# Patient Record
Sex: Female | Born: 1955 | Hispanic: No | Marital: Married | Smoking: Current every day smoker
Health system: Southern US, Community
[De-identification: ages and names within clinical notes are randomized; demographics above are authoritative.]

## PROBLEM LIST (undated history)

## (undated) DIAGNOSIS — I219 Acute myocardial infarction, unspecified: Secondary | ICD-10-CM

## (undated) DIAGNOSIS — E039 Hypothyroidism, unspecified: Secondary | ICD-10-CM

## (undated) DIAGNOSIS — E079 Disorder of thyroid, unspecified: Secondary | ICD-10-CM

## (undated) HISTORY — PX: NO PAST SURGERIES: SHX2092

---

## 2002-02-16 ENCOUNTER — Inpatient Hospital Stay (HOSPITAL_COMMUNITY): Admission: EM | Admit: 2002-02-16 | Discharge: 2002-02-27 | Payer: Self-pay | Admitting: Psychiatry

## 2002-03-20 ENCOUNTER — Encounter: Admission: RE | Admit: 2002-03-20 | Discharge: 2002-03-20 | Payer: Self-pay | Admitting: *Deleted

## 2003-10-27 ENCOUNTER — Other Ambulatory Visit: Payer: Self-pay

## 2003-10-28 ENCOUNTER — Other Ambulatory Visit: Payer: Self-pay

## 2008-09-28 ENCOUNTER — Emergency Department: Payer: Self-pay | Admitting: Emergency Medicine

## 2011-05-16 ENCOUNTER — Inpatient Hospital Stay: Payer: Self-pay | Admitting: Internal Medicine

## 2013-05-14 ENCOUNTER — Emergency Department: Payer: Self-pay

## 2013-10-27 ENCOUNTER — Emergency Department: Payer: Self-pay | Admitting: Emergency Medicine

## 2013-10-27 LAB — CBC WITH DIFFERENTIAL/PLATELET
Basophil #: 0.1 10*3/uL (ref 0.0–0.1)
Basophil %: 0.9 %
Eosinophil #: 0.1 10*3/uL (ref 0.0–0.7)
Eosinophil %: 1.4 %
HGB: 13.6 g/dL (ref 12.0–16.0)
Lymphocyte #: 1.8 10*3/uL (ref 1.0–3.6)
Lymphocyte %: 29.3 %
MCH: 27 pg (ref 26.0–34.0)
MCHC: 33.1 g/dL (ref 32.0–36.0)
MCV: 82 fL (ref 80–100)
Monocyte #: 0.6 x10 3/mm (ref 0.2–0.9)
Monocyte %: 10.1 %
Neutrophil #: 3.6 10*3/uL (ref 1.4–6.5)
Neutrophil %: 58.3 %
Platelet: 182 10*3/uL (ref 150–440)
RBC: 5.05 10*6/uL (ref 3.80–5.20)
RDW: 13.9 % (ref 11.5–14.5)
WBC: 6.2 10*3/uL (ref 3.6–11.0)

## 2013-10-27 LAB — BASIC METABOLIC PANEL
Anion Gap: 6 — ABNORMAL LOW (ref 7–16)
BUN: 9 mg/dL (ref 7–18)
Calcium, Total: 8.5 mg/dL (ref 8.5–10.1)
Chloride: 105 mmol/L (ref 98–107)
Co2: 30 mmol/L (ref 21–32)
EGFR (African American): >60
Glucose: 175 mg/dL — ABNORMAL HIGH (ref 65–99)
Osmolality: 284 (ref 275–301)
Potassium: 3 mmol/L — ABNORMAL LOW (ref 3.5–5.1)
Sodium: 141 mmol/L (ref 136–145)

## 2013-10-27 LAB — TROPONIN I: Troponin-I: <0.02 ng/mL

## 2013-10-27 LAB — URINALYSIS, COMPLETE
Glucose,UR: NEGATIVE mg/dL (ref 0–75)
Nitrite: NEGATIVE
Protein: 100
RBC,UR: 42 /HPF (ref 0–5)
Squamous Epithelial: 10
WBC UR: 9 /HPF (ref 0–5)

## 2017-07-22 ENCOUNTER — Emergency Department: Payer: Medicare Other

## 2017-07-22 ENCOUNTER — Observation Stay
Admission: EM | Admit: 2017-07-22 | Discharge: 2017-07-23 | Disposition: A | Discharge status: Home / Self Care | Payer: Medicare Other | Attending: Internal Medicine | Admitting: Internal Medicine

## 2017-07-22 ENCOUNTER — Encounter: Payer: Self-pay | Admitting: Emergency Medicine

## 2017-07-22 DIAGNOSIS — N179 Acute kidney failure, unspecified: Secondary | ICD-10-CM | POA: Diagnosis present

## 2017-07-22 DIAGNOSIS — F17200 Nicotine dependence, unspecified, uncomplicated: Secondary | ICD-10-CM | POA: Insufficient documentation

## 2017-07-22 DIAGNOSIS — I252 Old myocardial infarction: Secondary | ICD-10-CM | POA: Diagnosis not present

## 2017-07-22 DIAGNOSIS — E039 Hypothyroidism, unspecified: Secondary | ICD-10-CM | POA: Diagnosis not present

## 2017-07-22 DIAGNOSIS — Z91041 Radiographic dye allergy status: Secondary | ICD-10-CM | POA: Diagnosis not present

## 2017-07-22 DIAGNOSIS — R000 Tachycardia, unspecified: Secondary | ICD-10-CM | POA: Diagnosis not present

## 2017-07-22 DIAGNOSIS — N181 Chronic kidney disease, stage 1: Secondary | ICD-10-CM | POA: Insufficient documentation

## 2017-07-22 DIAGNOSIS — G9340 Encephalopathy, unspecified: Secondary | ICD-10-CM | POA: Diagnosis present

## 2017-07-22 DIAGNOSIS — Z1389 Encounter for screening for other disorder: Secondary | ICD-10-CM | POA: Diagnosis not present

## 2017-07-22 DIAGNOSIS — R55 Syncope and collapse: Principal | ICD-10-CM | POA: Diagnosis present

## 2017-07-22 HISTORY — DX: Acute myocardial infarction, unspecified: I21.9

## 2017-07-22 HISTORY — DX: Disorder of thyroid, unspecified: E07.9

## 2017-07-22 LAB — RAPID HIV SCREEN (HIV 1/2 AB+AG)
HIV 1/2 Antibodies: NONREACTIVE
HIV-1 P24 Antigen - HIV24: NONREACTIVE

## 2017-07-22 LAB — CBC
HEMATOCRIT: 40.2 % (ref 35.0–47.0)
Hemoglobin: 13.4 g/dL (ref 12.0–16.0)
MCH: 26.9 pg (ref 26.0–34.0)
MCHC: 33.5 g/dL (ref 32.0–36.0)
MCV: 80.4 fL (ref 80.0–100.0)
PLATELETS: 224 10*3/uL (ref 150–440)
RBC: 4.99 MIL/uL (ref 3.80–5.20)
RDW: 13.6 % (ref 11.5–14.5)
WBC: 8.1 10*3/uL (ref 3.6–11.0)

## 2017-07-22 LAB — HEPATIC FUNCTION PANEL
ALBUMIN: 3.6 g/dL (ref 3.5–5.0)
ALT: 25 U/L (ref 14–54)
AST: 34 U/L (ref 15–41)
Alkaline Phosphatase: 124 U/L (ref 38–126)
Total Bilirubin: 0.8 mg/dL (ref 0.3–1.2)
Total Protein: 7.5 g/dL (ref 6.5–8.1)

## 2017-07-22 LAB — TSH: TSH: 4.157 u[IU]/mL (ref 0.350–4.500)

## 2017-07-22 LAB — BASIC METABOLIC PANEL
Anion gap: 9 (ref 5–15)
BUN: 22 mg/dL — ABNORMAL HIGH (ref 6–20)
CHLORIDE: 105 mmol/L (ref 101–111)
CO2: 27 mmol/L (ref 22–32)
CREATININE: 1.25 mg/dL — AB (ref 0.44–1.00)
Calcium: 9.4 mg/dL (ref 8.9–10.3)
GFR calc non Af Amer: 45 mL/min — ABNORMAL LOW (ref >60)
GFR, EST AFRICAN AMERICAN: 53 mL/min — AB (ref >60)
Glucose, Bld: 108 mg/dL — ABNORMAL HIGH (ref 65–99)
POTASSIUM: 3.7 mmol/L (ref 3.5–5.1)
SODIUM: 141 mmol/L (ref 135–145)

## 2017-07-22 LAB — T4, FREE: Free T4: 0.81 ng/dL (ref 0.61–1.12)

## 2017-07-22 LAB — TROPONIN I

## 2017-07-22 LAB — AMMONIA: Ammonia: 11 umol/L (ref 9–35)

## 2017-07-22 NOTE — ED Notes (Addendum)
Pt returned from xray  Family at the bedside 

## 2017-07-22 NOTE — ED Provider Notes (Signed)
[facility] Emergency Department Provider Note    First MD Initiated Contact with Patient 07/22/17 2017     (approximate)  I have reviewed the triage vital signs and the nursing notes.   HISTORY  Chief Complaint Loss of Consciousness   HPI Mary Madden is a 61 y.o. female with a reported history of thyroid disease not currently taking any medications presents with an unwitnessed syncopal episode today that occurred somewhere between 3:00 and 3:30 while the patient was cooking dinner. Family has also noted increasing confusion but it is very difficult to obtained event of history from patient or family members whether this confusion is new as they state that the patient has a "chemical imbalance and is normally sort of confused but nothing like this ". No new medications. No fevers. No cough or shortness of breath. Denies any chest pain.   Past Medical History:  Diagnosis Date  . Thyroid disease    No family history on file. No past surgical history on file. There are no active problems to display for this patient.     Prior to Admission medications   Not on File    Allergies Contrast media [iodinated diagnostic agents]    Social History Social History  Substance Use Topics  . Smoking status: Current Every Day Smoker  . Smokeless tobacco: Never Used  . Alcohol use No    Review of Systems Patient denies headaches, rhinorrhea, blurry vision, numbness, shortness of breath, chest pain, edema, cough, abdominal pain, nausea, vomiting, diarrhea, dysuria, fevers, rashes or hallucinations unless otherwise stated above in HPI. ____________________________________________   PHYSICAL EXAM:  VITAL SIGNS: Vitals:   07/22/17 2137 07/22/17 2200  BP: 133/72 125/69  Pulse: 92 89  Resp: 14 18  Temp:    SpO2: 100% 99%    Constitutional: Alert slow to answer questions, poor historian, in no acute distress. Eyes: Conjunctivae are normal.  Head:  Atraumatic. Nose: No congestion/rhinnorhea. Mouth/Throat: Mucous membranes are moist.   Neck: No stridor. Painless ROM.  Cardiovascular: Normal rate, regular rhythm. Grossly normal heart sounds.  Good peripheral circulation. Respiratory: Normal respiratory effort.  No retractions. Lungs CTAB. Gastrointestinal: Soft and nontender. No distention. No abdominal bruits. No CVA tenderness. Genitourinary:  Musculoskeletal: No lower extremity tenderness nor edema.  No joint effusions. Neurologic:  Slow to answer questions. No gross focal neurologic deficits are appreciated. No facial droop.  No asterixis or tremor.  CN intact Skin:  Skin is warm, dry and intact. No rash noted. Psychiatric: Mood and affect are normal. Speech and behavior are normal.  ____________________________________________   LABS (all labs ordered are listed, but only abnormal results are displayed)  Results for orders placed or performed during the hospital encounter of 07/22/17 (from the past 24 hour(s))  Basic metabolic panel     Status: Abnormal   Collection Time: 07/22/17  8:16 PM  Result Value Ref Range   Sodium 141 135 - 145 mmol/L   Potassium 3.7 3.5 - 5.1 mmol/L   Chloride 105 101 - 111 mmol/L   CO2 27 22 - 32 mmol/L   Glucose, Bld 108 (H) 65 - 99 mg/dL   BUN 22 (H) 6 - 20 mg/dL   Creatinine, Ser 7.42 (H) 0.44 - 1.00 mg/dL   Calcium 9.4 8.9 - 59.5 mg/dL   GFR calc non Af Amer 45 (L) >60 mL/min   GFR calc Af Amer 53 (L) >60 mL/min   Anion gap 9 5 - 15  CBC  Status: None   Collection Time: 07/22/17  8:16 PM  Result Value Ref Range   WBC 8.1 3.6 - 11.0 K/uL   RBC 4.99 3.80 - 5.20 MIL/uL   Hemoglobin 13.4 12.0 - 16.0 g/dL   HCT 84.6 96.2 - 95.2 %   MCV 80.4 80.0 - 100.0 fL   MCH 26.9 26.0 - 34.0 pg   MCHC 33.5 32.0 - 36.0 g/dL   RDW 84.1 32.4 - 40.1 %   Platelets 224 150 - 440 K/uL  Hepatic function panel     Status: Abnormal   Collection Time: 07/22/17  8:34 PM  Result Value Ref Range   Total  Protein 7.5 6.5 - 8.1 g/dL   Albumin 3.6 3.5 - 5.0 g/dL   AST 34 15 - 41 U/L   ALT 25 14 - 54 U/L   Alkaline Phosphatase 124 38 - 126 U/L   Total Bilirubin 0.8 0.3 - 1.2 mg/dL   Bilirubin, Direct <0.2 (L) 0.1 - 0.5 mg/dL   Indirect Bilirubin NOT CALCULATED 0.3 - 0.9 mg/dL  Troponin I     Status: None   Collection Time: 07/22/17  8:34 PM  Result Value Ref Range   Troponin I <0.03 <0.03 ng/mL  TSH     Status: None   Collection Time: 07/22/17  8:34 PM  Result Value Ref Range   TSH 4.157 0.350 - 4.500 uIU/mL  T4, free     Status: None   Collection Time: 07/22/17  8:34 PM  Result Value Ref Range   Free T4 0.81 0.61 - 1.12 ng/dL  Ammonia     Status: None   Collection Time: 07/22/17  9:20 PM  Result Value Ref Range   Ammonia 11 9 - 35 umol/L  Blood gas, venous     Status: Abnormal (Preliminary result)   Collection Time: 07/22/17  9:20 PM  Result Value Ref Range   FIO2 0.21    pH, Ven 7.35 7.250 - 7.430   pCO2, Ven 52 44.0 - 60.0 mmHg   pO2, Ven PENDING 32.0 - 45.0 mmHg   Bicarbonate 28.7 (H) 20.0 - 28.0 mmol/L   Acid-Base Excess 2.0 0.0 - 2.0 mmol/L   O2 Saturation PENDING %   Patient temperature 37.0    Collection site VENOUS    Sample type VENOUS    ____________________________________________  EKG My review and personal interpretation at Time: 20:05   Indication: syncope  Rate: 100  Rhythm: sinus Axis: normal Other: normal intervals, no stemi, no depressions ____________________________________________  RADIOLOGY  I personally reviewed all radiographic images ordered to evaluate for the above acute complaints and reviewed radiology reports and findings.  These findings were personally discussed with the patient.  Please see medical record for radiology report.  ____________________________________________   PROCEDURES  Procedure(s) performed:  Procedures    Critical Care performed: no ____________________________________________   INITIAL IMPRESSION /  ASSESSMENT AND PLAN / ED COURSE  Pertinent labs & imaging results that were available during my care of the patient were reviewed by me and considered in my medical decision making (see chart for details).  DDX: Dehydration, sepsis, pna, uti, hypoglycemia, cva, drug effect, withdrawal, encephalitis   Mary Madden is a 61 y.o. who presents to the ED with symptoms as described above. Very limited history on the syncopal event. Patient does seem very confused and encephalopathic. No fever and no meningismus to suggest encephalitis. CT imaging will be ordered to evaluate for CVA or mass. On review the patient's medical record  it does appear that at some point she was on complera and had seen infectious disease at [facility] but denies any history of HIV and I cannot find any more history on this. Also has a history of hypothyroid. May be profoundly hypothyroid The patient will be placed on continuous pulse oximetry and telemetry for monitoring.  Laboratory evaluation will be sent to evaluate for the above complaints.       ----------------------------------------- 10:42 PM on 07/22/2017 -----------------------------------------  Patient with stable vital signs and no signs of dysrhythmia on the monitor but still confused and based on blood work here in the ER I do not have explanation for her encephalopathy or syncopal episode. Do believe the patient should be admitted to the hospital for further evaluation and management.  ____________________________________________   FINAL CLINICAL IMPRESSION(S) / ED DIAGNOSES  Final diagnoses:  Syncope and collapse  Acute encephalopathy      NEW MEDICATIONS STARTED DURING THIS VISIT:  New Prescriptions   No medications on file     Note:  This document was prepared using Dragon voice recognition software and may include unintentional dictation errors.    Willy Eddy, MD 07/22/17 [phone]

## 2017-07-22 NOTE — ED Triage Notes (Signed)
Patient presents to ED via POV from home. Patient was found by family earlier today on the floor "passed out" per family. EMS was called but patient refused transport. Patient reports left sided pain. Patient is slow to respond. Family reports patient has a chemical imbalance and is normally slightly confused "but nothing like this". Unable to obtain accurate medical history. Patient ambulatory to to room. Equal but weak hand grasps noted.

## 2017-07-23 LAB — BASIC METABOLIC PANEL
Anion gap: 9 (ref 5–15)
BUN: 20 mg/dL (ref 6–20)
CO2: 26 mmol/L (ref 22–32)
CREATININE: 0.97 mg/dL (ref 0.44–1.00)
Calcium: 9.1 mg/dL (ref 8.9–10.3)
Chloride: 107 mmol/L (ref 101–111)
Glucose, Bld: 85 mg/dL (ref 65–99)
POTASSIUM: 3.8 mmol/L (ref 3.5–5.1)
SODIUM: 142 mmol/L (ref 135–145)

## 2017-07-23 LAB — URINALYSIS, COMPLETE (UACMP) WITH MICROSCOPIC
BACTERIA UA: NONE SEEN
Bilirubin Urine: NEGATIVE
Glucose, UA: NEGATIVE mg/dL
Ketones, ur: 5 mg/dL — AB
NITRITE: NEGATIVE
PH: 5 (ref 5.0–8.0)
Protein, ur: NEGATIVE mg/dL
SPECIFIC GRAVITY, URINE: 1.019 (ref 1.005–1.030)

## 2017-07-23 LAB — CBC
HEMATOCRIT: 39.1 % (ref 35.0–47.0)
Hemoglobin: 13.2 g/dL (ref 12.0–16.0)
MCH: 27.3 pg (ref 26.0–34.0)
MCHC: 33.7 g/dL (ref 32.0–36.0)
MCV: 81.1 fL (ref 80.0–100.0)
PLATELETS: 204 10*3/uL (ref 150–440)
RBC: 4.82 MIL/uL (ref 3.80–5.20)
RDW: 13.9 % (ref 11.5–14.5)
WBC: 6.1 10*3/uL (ref 3.6–11.0)

## 2017-07-23 LAB — URINE DRUG SCREEN, QUALITATIVE (ARMC ONLY)
Amphetamines, Ur Screen: NOT DETECTED
Barbiturates, Ur Screen: NOT DETECTED
Benzodiazepine, Ur Scrn: NOT DETECTED
COCAINE METABOLITE, UR ~~LOC~~: NOT DETECTED
Cannabinoid 50 Ng, Ur ~~LOC~~: NOT DETECTED
MDMA (ECSTASY) UR SCREEN: NOT DETECTED
METHADONE SCREEN, URINE: NOT DETECTED
OPIATE, UR SCREEN: NOT DETECTED
Phencyclidine (PCP) Ur S: NOT DETECTED
TRICYCLIC, UR SCREEN: NOT DETECTED

## 2017-07-23 LAB — TROPONIN I: Troponin I: <0.03 ng/mL (ref <0.03)

## 2017-07-23 MED ORDER — ENOXAPARIN SODIUM 40 MG/0.4ML ~~LOC~~ SOLN: 40.0000 mg | SUBCUTANEOUS | Status: DC

## 2017-07-23 MED ORDER — ACETAMINOPHEN 650 MG RE SUPP: 650.0000 mg | Freq: Four times a day (QID) | RECTAL | Status: DC | PRN

## 2017-07-23 MED ORDER — ONDANSETRON HCL 4 MG/2ML IJ SOLN
4.0000 mg | Freq: Four times a day (QID) | INTRAMUSCULAR | Status: DC | PRN
Start: 2017-07-23 — End: 2017-07-23

## 2017-07-23 MED ORDER — ACETAMINOPHEN 325 MG PO TABS
325.0000 mg | ORAL_TABLET | Freq: Four times a day (QID) | ORAL | Status: DC | PRN
Start: 2017-07-23 — End: 2020-06-14

## 2017-07-23 MED ORDER — ACETAMINOPHEN 325 MG PO TABS
650.0000 mg | ORAL_TABLET | Freq: Four times a day (QID) | ORAL | Status: DC | PRN
  Administered 2017-07-23: 650 mg via ORAL
  Filled 2017-07-23: qty 2

## 2017-07-23 MED ORDER — ONDANSETRON HCL 4 MG PO TABS: 4.0000 mg | ORAL_TABLET | Freq: Four times a day (QID) | ORAL | Status: DC | PRN

## 2017-07-23 NOTE — ED Notes (Signed)
Patient transported to room 249 

## 2017-07-23 NOTE — Discharge Summary (Signed)
[facility] [place] [facility]   PATIENT NAME: Mary Madden    MR#:  [id number]  DATE OF BIRTH:  [birth year]  DATE OF ADMISSION:  07/22/2017 ADMITTING PHYSICIAN: Oralia Manis, MD  DATE OF DISCHARGE: 07/23/17  PRIMARY CARE PHYSICIAN: Patient, No Pcp Per    ADMISSION DIAGNOSIS:  Syncope and collapse [R55] Acute encephalopathy [G93.40]  DISCHARGE DIAGNOSIS:  Principal Problem:   Syncope and collapse Active Problems:   AKI (acute kidney injury) (HCC)   Hypothyroidism   SECONDARY DIAGNOSIS:   Past Medical History:  Diagnosis Date  . MI (myocardial infarction) (HCC)   . Thyroid disease     HOSPITAL COURSE:   HPI   Mary Madden  is a 61 y.o. female who presents with syncopal episode. Patient does not recall the event and it was unwitnessed at the time. Her daughter was at home with her today but was just leaving. Her husband was coming home, but was not quite home yet. Patient states she was cooking something in the kitchen, and then does not recall what happened. Her spouse found her on the floor in the kitchen when he arrived just after their daughter had left and called EMS. Here in the ED the patient's workup was largely within normal limits initially. 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 Hospitalists were called for admission and further evaluation  Syncope and collapse - Seemed to be from dehydration Acute MI ruled out with negative troponins Lightheadedness improved get an echocardiogram and cardiology Follow-up as an outpatient discussed with Dr. Vennie Homans , would like to see the patient is an outpatient in 2-3 days this week . Okay to discharge patient from cardiology standpoint     AKI (acute kidney injury) (HCC) -Improved with IV fluids , renal function is back to normal     Hypothyroidism - TSH and free T4 are normal, patient is not on any home medications  DISCHARGE  CONDITIONS:  FAIR  CONSULTS OBTAINED:  Treatment Team:  Alwyn Pea, MD   PROCEDURES none   DRUG ALLERGIES:   Allergies  Allergen Reactions  . Contrast Media [Iodinated Diagnostic Agents] Other (See Comments)    Reaction: unknown    DISCHARGE MEDICATIONS:  There are no discharge medications for this patient.    DISCHARGE INSTRUCTIONS:   F/u with cardio - dr.callwood in 2-3 days, OP echo Follow-up with primary care physician in a week   DIET:  Regular diet  DISCHARGE CONDITION:  Stable  ACTIVITY:  Activity as tolerated  OXYGEN:  Home Oxygen: No.   Oxygen Delivery: room air  DISCHARGE LOCATION:  home   If you experience worsening of your admission symptoms, develop shortness of breath, life threatening emergency, suicidal or homicidal thoughts you must seek medical attention immediately by calling 911 or calling your MD immediately  if symptoms less severe.  You Must read complete instructions/literature along with all the possible adverse reactions/side effects for all the Medicines you take and that have been prescribed to you. Take any new Medicines after you have completely understood and accpet all the possible adverse reactions/side effects.   Please note  You were cared for by a hospitalist during your hospital stay. If you have any questions about your discharge medications or the care you received while you were in the hospital after you are discharged, you can call the unit and asked to speak with the hospitalist on call if the hospitalist  that took care of you is not available. Once you are discharged, your primary care physician will handle any further medical issues. Please note that NO REFILLS for any discharge medications will be authorized once you are discharged, as it is imperative that you return to your primary care physician (or establish a relationship with a primary care physician if you do not have one) for your aftercare needs so that  they can reassess your need for medications and monitor your lab values.     Today  Chief Complaint  Patient presents with  . Loss of Consciousness   Patient denies any complaints today lightheadedness improved and denies any chest pain or shortness of breath. Daughter Mary Madden at bedside.  ROS:  CONSTITUTIONAL: Denies fevers, chills. Denies any fatigue, weakness.  EYES: Denies blurry vision, double vision, eye pain. EARS, NOSE, THROAT: Denies tinnitus, ear pain, hearing loss. RESPIRATORY: Denies cough, wheeze, shortness of breath.  CARDIOVASCULAR: Denies chest pain, palpitations, edema.  GASTROINTESTINAL: Denies nausea, vomiting, diarrhea, abdominal pain. Denies bright red blood per rectum. GENITOURINARY: Denies dysuria, hematuria. ENDOCRINE: Denies nocturia or thyroid problems. HEMATOLOGIC AND LYMPHATIC: Denies easy bruising or bleeding. SKIN: Denies rash or lesion. MUSCULOSKELETAL: Denies pain in neck, back, shoulder, knees, hips or arthritic symptoms.  NEUROLOGIC: Denies paralysis, paresthesias.  PSYCHIATRIC: Denies anxiety or depressive symptoms.   VITAL SIGNS:  Blood pressure (!) 161/81, pulse 94, temperature 97.9 F (36.6 C), resp. rate 14, height  (1.626 m), weight 65.6 kg (144 lb 9.6 oz), SpO2 100 %.  I/O:   Intake/Output Summary (Last 24 hours) at 07/23/17 1228 Last data filed at 07/23/17 0500  Gross per 24 hour  Intake                0 ml  Output                0 ml  Net                0 ml    PHYSICAL EXAMINATION:  GENERAL:  61 y.o.-year-old patient lying in the bed with no acute distress.  EYES: Pupils equal, round, reactive to light and accommodation. No scleral icterus. Extraocular muscles intact.  HEENT: Head atraumatic, normocephalic. Oropharynx and nasopharynx clear.  NECK:  Supple, no jugular venous distention. No thyroid enlargement, no tenderness.  LUNGS: Normal breath sounds bilaterally, no wheezing, rales,rhonchi or crepitation. No use of  accessory muscles of respiration.  CARDIOVASCULAR: S1, S2 normal. No murmurs, rubs, or gallops.  ABDOMEN: Soft, non-tender, non-distended. Bowel sounds present. No organomegaly or mass.  EXTREMITIES: No pedal edema, cyanosis, or clubbing.  NEUROLOGIC: Cranial nerves II through XII are intact. Muscle strength 5/5 in all extremities. Sensation intact. Gait not checked.  PSYCHIATRIC: The patient is alert and oriented x 3.  SKIN: No obvious rash, lesion, or ulcer.   DATA REVIEW:   CBC  Recent Labs Lab 07/23/17 0927  WBC 6.1  HGB 13.2  HCT 39.1  PLT 204    Chemistries   Recent Labs Lab 07/22/17 2034 07/23/17 0927  NA  --  142  K  --  3.8  CL  --  107  CO2  --  26  GLUCOSE  --  85  BUN  --  20  CREATININE  --  0.97  CALCIUM  --  9.1  AST 34  --   ALT 25  --   ALKPHOS 124  --   BILITOT 0.8  --     Cardiac Enzymes  Recent Labs Lab 07/23/17 0927  TROPONINI <0.03    Microbiology Results  No results found for this or any previous visit.  RADIOLOGY:  Dg Chest 2 View  Result Date: 07/22/2017 CLINICAL DATA:  Initial evaluation for acute altered mental status. Concern for pneumonia. EXAM: CHEST  2 VIEW COMPARISON:  Prior radiograph from 05/16/2011. FINDINGS: The cardiac and mediastinal silhouettes are stable in size and contour, and remain within normal limits. The lungs are normally inflated. No airspace consolidation, pleural effusion, or pulmonary edema is identified. There is no pneumothorax. No acute osseous abnormality identified. IMPRESSION: No radiographic evidence for active cardiopulmonary disease. Electronically Signed   By: Rise Mu M.D.   On: 07/22/2017 21:15   Ct Head Wo Contrast  Result Date: 07/22/2017 CLINICAL DATA:  Altered level of consciousness EXAM: CT HEAD WITHOUT CONTRAST TECHNIQUE: Contiguous axial images were obtained from the base of the skull through the vertex without intravenous contrast. COMPARISON:  10/27/2013 FINDINGS: Brain: No  acute territorial infarction, hemorrhage or intracranial mass is seen. The ventricles are nonenlarged. Vascular: No hyperdense vessels.  Carotid artery calcification. Skull: Normal. Negative for fracture or focal lesion. Sinuses/Orbits: No acute finding. Other: None IMPRESSION: No CT evidence for acute intracranial abnormality Electronically Signed   By: Jasmine Pang M.D.   On: 07/22/2017 20:56   Dg Shoulder Left  Result Date: 07/22/2017 CLINICAL DATA:  Fall onto left shoulder EXAM: LEFT SHOULDER - 2+ VIEW COMPARISON:  Chest x-ray 07/22/2017 FINDINGS: Mild glenohumeral degenerative changes. No acute fracture or malalignment. Mild AC joint degenerative change. The left lung apex is clear IMPRESSION: Mild degenerative changes.  No acute osseous abnormality. Electronically Signed   By: Jasmine Pang M.D.   On: 07/22/2017 23:09    EKG:   Orders placed or performed during the hospital encounter of 07/22/17  . ED EKG  . ED EKG      Management plans discussed with the patient, family and they are in agreement.  CODE STATUS:     Code Status Orders        Start     Ordered   07/23/17 0121  Full code  Continuous     07/23/17 0120    Code Status History    Date Active Date Inactive Code Status Order ID Comments User Context   This patient has a current code status but no historical code status.      TOTAL TIME TAKING CARE OF THIS PATIENT: 43 minutes.   Note: This dictation was prepared with Dragon dictation along with smaller phrase technology. Any transcriptional errors that result from this process are unintentional.   @  on 07/23/2017 at 12:28 PM  Between 7am to 6pm - Pager - [phone]  After 6pm go to [email or web address] - password [email or web address]  [facility]  Office  [phone]  CC: Primary care physician; Patient, No Pcp Per

## 2017-07-23 NOTE — ED Notes (Signed)
Dr. Anne Hahn at the bedside for pt evaluation

## 2017-07-23 NOTE — H&P (Signed)
[facility] [place] [facility]   PATIENT NAME: Mary Madden    MR#:  [id number]  DATE OF BIRTH:  07/18/1956  DATE OF ADMISSION:  07/22/2017  PRIMARY CARE PHYSICIAN: Patient, No Pcp Per   REQUESTING/REFERRING PHYSICIAN: Roxan Hockey, MD  CHIEF COMPLAINT:   Chief Complaint  Patient presents with  . Loss of Consciousness    HISTORY OF PRESENT ILLNESS:  Mary Madden  is a 61 y.o. female who presents with syncopal episode. Patient does not recall the event and it was unwitnessed at the time. Her daughter was at home with her today but was just leaving. Her husband was coming home, but was not quite home yet. Patient states she was cooking something in the kitchen, and then does not recall what happened. Her spouse found her on the floor in the kitchen when he arrived just after their daughter had left and called EMS. Here in the ED the patient's workup was largely within normal limits initially. 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 Hospitalists were called for admission and further evaluation  PAST MEDICAL HISTORY:   Past Medical History:  Diagnosis Date  . MI (myocardial infarction) (HCC)   . Thyroid disease     PAST SURGICAL HISTORY:   Past Surgical History:  Procedure Laterality Date  . NO PAST SURGERIES      SOCIAL HISTORY:   Social History  Substance Use Topics  . Smoking status: Current Every Day Smoker  . Smokeless tobacco: Never Used  . Alcohol use No    FAMILY HISTORY:   Family History  Problem Relation Age of Onset  . Family history unknown: Yes    DRUG ALLERGIES:   Allergies  Allergen Reactions  . Contrast Media [Iodinated Diagnostic Agents] Other (See Comments)    Reaction: unknown    MEDICATIONS AT HOME:   Prior to Admission medications   Not on File    REVIEW OF SYSTEMS:  Review of Systems  Constitutional: Negative for chills, fever, malaise/fatigue  and weight loss.  HENT: Negative for ear pain, hearing loss and tinnitus.   Eyes: Negative for blurred vision, double vision, pain and redness.  Respiratory: Negative for cough, hemoptysis and shortness of breath.   Cardiovascular: Negative for chest pain, palpitations, orthopnea and leg swelling.  Gastrointestinal: Negative for abdominal pain, constipation, diarrhea, nausea and vomiting.  Genitourinary: Negative for dysuria, frequency and hematuria.  Musculoskeletal: Negative for back pain, joint pain and neck pain.  Skin:       No acne, rash, or lesions  Neurological: Positive for loss of consciousness. Negative for dizziness, tremors, focal weakness and weakness.  Endo/Heme/Allergies: Negative for polydipsia. Does not bruise/bleed easily.  Psychiatric/Behavioral: Negative for depression. The patient is not nervous/anxious and does not have insomnia.      VITAL SIGNS:   Vitals:   07/22/17 2002 07/22/17 2137 07/22/17 2200 07/22/17 2330  BP: 137/77 133/72 125/69 (!) 147/82  Pulse: (!) 106 92 89 86  Resp: Temp: 98.2 F (36.8 C)     TempSrc: Oral     SpO2: 99% 100% 99% 100%  Weight: 72.6 kg (160 lb)     Height:  (1.626 m)      Wt Readings from Last 3 Encounters:  07/22/17 72.6 kg (160 lb)    PHYSICAL EXAMINATION:  Physical Exam  Vitals reviewed. Constitutional: She is oriented to person, place, and time. She appears  well-developed and well-nourished. No distress.  HENT:  Head: Normocephalic and atraumatic.  Mouth/Throat: Oropharynx is clear and moist.  Eyes: Pupils are equal, round, and reactive to light. Conjunctivae and EOM are normal. No scleral icterus.  Neck: Normal range of motion. Neck supple. No JVD present. No thyromegaly present.  Cardiovascular: Normal rate, regular rhythm and intact distal pulses.  Exam reveals no gallop and no friction rub.   No murmur heard. Respiratory: Effort normal and breath sounds normal. No respiratory distress. She has  no wheezes. She has no rales.  GI: Soft. Bowel sounds are normal. She exhibits no distension. There is no tenderness.  Musculoskeletal: Normal range of motion. She exhibits no edema.  No arthritis, no gout  Lymphadenopathy:    She has no cervical adenopathy.  Neurological: She is alert and oriented to person, place, and time. No cranial nerve deficit.  No dysarthria, no aphasia. Patient is oriented, but has difficulty answering several questions and some answers seem somewhat confused  Skin: Skin is warm and dry. No rash noted. No erythema.  Psychiatric: She has a normal mood and affect. Her behavior is normal. Judgment and thought content normal.    LABORATORY PANEL:   CBC  Recent Labs Lab 07/22/17 2016  WBC 8.1  HGB 13.4  HCT 40.2  PLT 224   ------------------------------------------------------------------------------------------------------------------  Chemistries   Recent Labs Lab 07/22/17 2016 07/22/17 2034  NA 141  --   K 3.7  --   CL 105  --   CO2 27  --   GLUCOSE 108*  --   BUN 22*  --   CREATININE 1.25*  --   CALCIUM 9.4  --   AST  --  34  ALT  --  25  ALKPHOS  --  124  BILITOT  --  0.8   ------------------------------------------------------------------------------------------------------------------  Cardiac Enzymes  Recent Labs Lab 07/22/17 2034  TROPONINI <0.03   ------------------------------------------------------------------------------------------------------------------  RADIOLOGY:  Dg Chest 2 View  Result Date: 07/22/2017 CLINICAL DATA:  Initial evaluation for acute altered mental status. Concern for pneumonia. EXAM: CHEST  2 VIEW COMPARISON:  Prior radiograph from 05/16/2011. FINDINGS: The cardiac and mediastinal silhouettes are stable in size and contour, and remain within normal limits. The lungs are normally inflated. No airspace consolidation, pleural effusion, or pulmonary edema is identified. There is no pneumothorax. No acute  osseous abnormality identified. IMPRESSION: No radiographic evidence for active cardiopulmonary disease. Electronically Signed   By: Rise Mu M.D.   On: 07/22/2017 21:15   Ct Head Wo Contrast  Result Date: 07/22/2017 CLINICAL DATA:  Altered level of consciousness EXAM: CT HEAD WITHOUT CONTRAST TECHNIQUE: Contiguous axial images were obtained from the base of the skull through the vertex without intravenous contrast. COMPARISON:  10/27/2013 FINDINGS: Brain: No acute territorial infarction, hemorrhage or intracranial mass is seen. The ventricles are nonenlarged. Vascular: No hyperdense vessels.  Carotid artery calcification. Skull: Normal. Negative for fracture or focal lesion. Sinuses/Orbits: No acute finding. Other: None IMPRESSION: No CT evidence for acute intracranial abnormality Electronically Signed   By: Jasmine Pang M.D.   On: 07/22/2017 20:56   Dg Shoulder Left  Result Date: 07/22/2017 CLINICAL DATA:  Fall onto left shoulder EXAM: LEFT SHOULDER - 2+ VIEW COMPARISON:  Chest x-ray 07/22/2017 FINDINGS: Mild glenohumeral degenerative changes. No acute fracture or malalignment. Mild AC joint degenerative change. The left lung apex is clear IMPRESSION: Mild degenerative changes.  No acute osseous abnormality. Electronically Signed   By: Adrian Prows.D.  On: 07/22/2017 23:09    EKG:   Orders placed or performed during the hospital encounter of 07/22/17  . ED EKG  . ED EKG    IMPRESSION AND PLAN:  Principal Problem:   Syncope and collapse - trend cardiac enzymes, get an echocardiogram and cardiology consult Active Problems:   AKI (acute kidney injury) (HCC) - gentle IV fluids, avoid nephrotoxins and monitor   Hypothyroidism - home dose thyroid replacement  All the records are reviewed and case discussed with ED provider. Management plans discussed with the patient and/or family.  DVT PROPHYLAXIS: SubQ lovenox  GI PROPHYLAXIS: None  ADMISSION STATUS: Observation  CODE  STATUS: Full Code Status History    This patient does not have a recorded code status. Please follow your organizational policy for patients in this situation.      TOTAL TIME TAKING CARE OF THIS PATIENT: 40 minutes.   Tiesha Marich FIELDING 07/23/2017, 12:10 AM  [employer]  [phone]  CC: Primary care physician; Patient, No Pcp Per  Note:  This document was prepared using Dragon voice recognition software and may include unintentional dictation errors.

## 2017-07-23 NOTE — Discharge Instructions (Signed)
F/u with cardio - dr.callwood in 2-3 days, OP echo Follow-up with primary care physician in a week

## 2017-07-23 NOTE — Progress Notes (Signed)
Pt and daughter who is at bedside refused bed alarm. Importance of bed alarm and safety measures reviewed, however they still refused bed alarm.

## 2017-07-23 NOTE — Consult Note (Signed)
Reason for Consult:syncope Referring Physician: Dr. Lance Coon hospitalist  Mary Madden is an 61 y.o. female.  HPI: patient presents after syncopal episode at home patient does not recall the incident, the episode was unwitnessed. Daughter was at home but was just leaving her husband was on his way home but not quite. She was essentially alone. Patient states she was cooking something EN does not recall what happened but felt funny and essentially was found on the floor. Denies previous episode of syncope. Denies any headache dizziness now denies any chest pain denies any palpitations or tachycardia  Past Medical History:  Diagnosis Date  . MI (myocardial infarction) ([place])   . Thyroid disease     Past Surgical History:  Procedure Laterality Date  . NO PAST SURGERIES      Family History  Problem Relation Age of Onset  . Family history unknown: Yes    Social History:  reports that she has been smoking.  She has never used smokeless tobacco. She reports that she does not drink alcohol or use drugs.  Allergies:  Allergies  Allergen Reactions  . Contrast Media [Iodinated Diagnostic Agents] Other (See Comments)    Reaction: unknown    Medications: I have reviewed the patient's current medications.  Results for orders placed or performed during the hospital encounter of 07/22/17 (from the past 48 hour(s))  Basic metabolic panel     Status: Abnormal   Collection Time: 07/22/17  8:16 PM  Result Value Ref Range   Sodium 141 135 - 145 mmol/L   Potassium 3.7 3.5 - 5.1 mmol/L   Chloride 105 101 - 111 mmol/L   CO2 27 22 - 32 mmol/L   Glucose, Bld 108 (H) 65 - 99 mg/dL   BUN 22 (H) 6 - 20 mg/dL   Creatinine, Ser 1.25 (H) 0.44 - 1.00 mg/dL   Calcium 9.4 8.9 - 10.3 mg/dL   GFR calc non Af Amer 45 (L) >60 mL/min   GFR calc Af Amer 53 (L) >60 mL/min    Comment: (NOTE) The eGFR has been calculated using the CKD EPI equation. This calculation has not been validated in all clinical  situations. eGFR's persistently <60 mL/min signify possible Chronic Kidney Disease.    Anion gap 9 5 - 15  CBC     Status: None   Collection Time: 07/22/17  8:16 PM  Result Value Ref Range   WBC 8.1 3.6 - 11.0 K/uL   RBC 4.99 3.80 - 5.20 MIL/uL   Hemoglobin 13.4 12.0 - 16.0 g/dL   HCT 40.2 35.0 - 47.0 %   MCV 80.4 80.0 - 100.0 fL   MCH 26.9 26.0 - 34.0 pg   MCHC 33.5 32.0 - 36.0 g/dL   RDW 13.6 11.5 - 14.5 %   Platelets 224 150 - 440 K/uL  Rapid HIV screen (HIV 1/2 Ab+Ag)     Status: None   Collection Time: 07/22/17  8:16 PM  Result Value Ref Range   HIV-1 P24 Antigen - HIV24 NON REACTIVE NON REACTIVE   HIV 1/2 Antibodies NON REACTIVE NON REACTIVE   Interpretation (HIV Ag Ab)      A non reactive test result means that HIV 1 or HIV 2 antibodies and HIV 1 p24 antigen were not detected in the specimen.  Hepatic function panel     Status: Abnormal   Collection Time: 07/22/17  8:34 PM  Result Value Ref Range   Total Protein 7.5 6.5 - 8.1 g/dL   Albumin 3.6  3.5 - 5.0 g/dL   AST 34 15 - 41 U/L   ALT 25 14 - 54 U/L   Alkaline Phosphatase 124 38 - 126 U/L   Total Bilirubin 0.8 0.3 - 1.2 mg/dL   Bilirubin, Direct <0.1 (L) 0.1 - 0.5 mg/dL   Indirect Bilirubin NOT CALCULATED 0.3 - 0.9 mg/dL  Troponin I     Status: None   Collection Time: 07/22/17  8:34 PM  Result Value Ref Range   Troponin I <0.03 <0.03 ng/mL  TSH     Status: None   Collection Time: 07/22/17  8:34 PM  Result Value Ref Range   TSH 4.157 0.350 - 4.500 uIU/mL    Comment: Performed by a 3rd Generation assay with a functional sensitivity of <=0.01 uIU/mL.  T4, free     Status: None   Collection Time: 07/22/17  8:34 PM  Result Value Ref Range   Free T4 0.81 0.61 - 1.12 ng/dL    Comment: (NOTE) Biotin ingestion may interfere with free T4 tests. If the results are inconsistent with the TSH level, previous test results, or the clinical presentation, then consider biotin interference. If needed, order repeat testing  after stopping biotin.   Ammonia     Status: None   Collection Time: 07/22/17  9:20 PM  Result Value Ref Range   Ammonia 11 9 - 35 umol/L  Blood gas, venous     Status: Abnormal (Preliminary result)   Collection Time: 07/22/17  9:20 PM  Result Value Ref Range   FIO2 0.21    pH, Ven 7.35 7.250 - 7.430    Comment: CRITICAL RESULT CALLED TO, READ BACK BY AND VERIFIED WITH:  CRITICAL VBG PO2 VALUE RESULTS READ TO RN AT 2151 ON [id number] RESULTS VERIFIED.  WILL INCREASE PTS FIO2.    pCO2, Ven 52 44.0 - 60.0 mmHg   pO2, Ven PENDING 32.0 - 45.0 mmHg   Bicarbonate 28.7 (H) 20.0 - 28.0 mmol/L   Acid-Base Excess 2.0 0.0 - 2.0 mmol/L   O2 Saturation PENDING %   Patient temperature 37.0    Collection site VENOUS    Sample type VENOUS   Troponin I     Status: None   Collection Time: 07/23/17  2:31 AM  Result Value Ref Range   Troponin I <0.03 <0.03 ng/mL  Urinalysis, Complete w Microscopic     Status: Abnormal   Collection Time: 07/23/17  7:20 AM  Result Value Ref Range   Color, Urine YELLOW (A) YELLOW   APPearance CLEAR (A) CLEAR   Specific Gravity, Urine 1.019 1.005 - 1.030   pH 5.0 5.0 - 8.0   Glucose, UA NEGATIVE NEGATIVE mg/dL   Hgb urine dipstick SMALL (A) NEGATIVE   Bilirubin Urine NEGATIVE NEGATIVE   Ketones, ur 5 (A) NEGATIVE mg/dL   Protein, ur NEGATIVE NEGATIVE mg/dL   Nitrite NEGATIVE NEGATIVE   Leukocytes, UA TRACE (A) NEGATIVE   RBC / HPF 0-5 0 - 5 RBC/hpf   WBC, UA 0-5 0 - 5 WBC/hpf   Bacteria, UA NONE SEEN NONE SEEN   Squamous Epithelial / LPF 6-30 (A) NONE SEEN   Mucus PRESENT   Urine Drug Screen, Qualitative ([facility] only)     Status: None   Collection Time: 07/23/17  7:20 AM  Result Value Ref Range   Tricyclic, Ur Screen NONE DETECTED NONE DETECTED   Amphetamines, Ur Screen NONE DETECTED NONE DETECTED   MDMA (Ecstasy)Ur Screen NONE DETECTED NONE DETECTED   Cocaine Metabolite,Ur  NONE  DETECTED NONE DETECTED   Opiate, Ur Screen NONE DETECTED NONE DETECTED    Phencyclidine (PCP) Ur S NONE DETECTED NONE DETECTED   Cannabinoid 50 Ng, Ur [place] NONE DETECTED NONE DETECTED   Barbiturates, Ur Screen NONE DETECTED NONE DETECTED   Benzodiazepine, Ur Scrn NONE DETECTED NONE DETECTED   Methadone Scn, Ur NONE DETECTED NONE DETECTED    Comment: (NOTE) 062  Tricyclics, urine               Cutoff 1000 ng/mL 200  Amphetamines, urine             Cutoff 1000 ng/mL 300  MDMA (Ecstasy), urine           Cutoff 500 ng/mL 400  Cocaine Metabolite, urine       Cutoff 300 ng/mL 500  Opiate, urine                   Cutoff 300 ng/mL 600  Phencyclidine (PCP), urine      Cutoff 25 ng/mL 700  Cannabinoid, urine              Cutoff 50 ng/mL 800  Barbiturates, urine             Cutoff 200 ng/mL 900  Benzodiazepine, urine           Cutoff 200 ng/mL 1000 Methadone, urine                Cutoff 300 ng/mL 1100 1200 The urine drug screen provides only a preliminary, unconfirmed 1300 analytical test result and should not be used for non-medical 1400 purposes. Clinical consideration and professional judgment should 1500 be applied to any positive drug screen result due to possible 1600 interfering substances. A more specific alternate chemical method 1700 must be used in order to obtain a confirmed analytical result.  1800 Gas chromato graphy / mass spectrometry (GC/MS) is the preferred 1900 confirmatory method.   Troponin I     Status: None   Collection Time: 07/23/17  9:27 AM  Result Value Ref Range   Troponin I <0.03 <0.03 ng/mL  Basic metabolic panel     Status: None   Collection Time: 07/23/17  9:27 AM  Result Value Ref Range   Sodium 142 135 - 145 mmol/L   Potassium 3.8 3.5 - 5.1 mmol/L   Chloride 107 101 - 111 mmol/L   CO2 26 22 - 32 mmol/L   Glucose, Bld 85 65 - 99 mg/dL   BUN 20 6 - 20 mg/dL   Creatinine, Ser 0.97 0.44 - 1.00 mg/dL   Calcium 9.1 8.9 - 10.3 mg/dL   GFR calc non Af Amer >60 >60 mL/min   GFR calc Af Amer >60 >60 mL/min    Comment: (NOTE) The eGFR  has been calculated using the CKD EPI equation. This calculation has not been validated in all clinical situations. eGFR's persistently <60 mL/min signify possible Chronic Kidney Disease.    Anion gap 9 5 - 15  CBC     Status: None   Collection Time: 07/23/17  9:27 AM  Result Value Ref Range   WBC 6.1 3.6 - 11.0 K/uL   RBC 4.82 3.80 - 5.20 MIL/uL   Hemoglobin 13.2 12.0 - 16.0 g/dL   HCT 39.1 35.0 - 47.0 %   MCV 81.1 80.0 - 100.0 fL   MCH 27.3 26.0 - 34.0 pg   MCHC 33.7 32.0 - 36.0 g/dL   RDW 13.9 11.5 - 14.5 %  Platelets 204 150 - 440 K/uL    Dg Chest 2 View  Result Date: 07/22/2017 CLINICAL DATA:  Initial evaluation for acute altered mental status. Concern for pneumonia. EXAM: CHEST  2 VIEW COMPARISON:  Prior radiograph from 05/16/2011. FINDINGS: The cardiac and mediastinal silhouettes are stable in size and contour, and remain within normal limits. The lungs are normally inflated. No airspace consolidation, pleural effusion, or pulmonary edema is identified. There is no pneumothorax. No acute osseous abnormality identified. IMPRESSION: No radiographic evidence for active cardiopulmonary disease. Electronically Signed   By: Jeannine Boga M.D.   On: 07/22/2017 21:15   Ct Head Wo Contrast  Result Date: 07/22/2017 CLINICAL DATA:  Altered level of consciousness EXAM: CT HEAD WITHOUT CONTRAST TECHNIQUE: Contiguous axial images were obtained from the base of the skull through the vertex without intravenous contrast. COMPARISON:  10/27/2013 FINDINGS: Brain: No acute territorial infarction, hemorrhage or intracranial mass is seen. The ventricles are nonenlarged. Vascular: No hyperdense vessels.  Carotid artery calcification. Skull: Normal. Negative for fracture or focal lesion. Sinuses/Orbits: No acute finding. Other: None IMPRESSION: No CT evidence for acute intracranial abnormality Electronically Signed   By: Donavan Foil M.D.   On: 07/22/2017 20:56   Dg Shoulder Left  Result Date:  07/22/2017 CLINICAL DATA:  Fall onto left shoulder EXAM: LEFT SHOULDER - 2+ VIEW COMPARISON:  Chest x-ray 07/22/2017 FINDINGS: Mild glenohumeral degenerative changes. No acute fracture or malalignment. Mild AC joint degenerative change. The left lung apex is clear IMPRESSION: Mild degenerative changes.  No acute osseous abnormality. Electronically Signed   By: Donavan Foil M.D.   On: 07/22/2017 23:09    Review of Systems  Constitutional: Positive for malaise/fatigue.  HENT: Negative.   Eyes: Negative.   Respiratory: Positive for shortness of breath.   Cardiovascular: Positive for palpitations.  Gastrointestinal: Positive for heartburn.  Genitourinary: Negative.   Musculoskeletal: Positive for falls and myalgias.  Skin: Negative.   Neurological: Positive for dizziness, loss of consciousness, weakness and headaches.  Endo/Heme/Allergies: Negative.   Psychiatric/Behavioral: Negative.    Blood pressure (!) 161/81, pulse 94, temperature 97.9 F (36.6 C), resp. rate 14, height _0  (1.626 m), weight 144 lb 9.6 oz (65.6 kg), SpO2 100 %. Physical Exam  Nursing note and vitals reviewed. Constitutional: She is oriented to person, place, and time. She appears well-developed and well-nourished.  HENT:  Head: Normocephalic and atraumatic.  Eyes: Pupils are equal, round, and reactive to light. Conjunctivae and EOM are normal.  Neck: Normal range of motion. Neck supple.  Cardiovascular: Normal rate and regular rhythm.   Murmur heard. Respiratory: Effort normal and breath sounds normal.  GI: Soft. Bowel sounds are normal.  Musculoskeletal: Normal range of motion.  Neurological: She is alert and oriented to person, place, and time. She has normal reflexes.  Skin: Skin is warm and dry.  Psychiatric: She has a normal mood and affect.    Assessment/Plan: Syncope History of myocardial infarction Thyroid disease Smoking Chronic renal insufficiency stage I . Plan Agree with initial workup for  syncope CT of the head consider MRI Carotid Dopplers will be helpful Recommend echocardiogram for assessment of valvular structure wall motion Holter as an outpatient may be helpful Recommend functional study exercise Myoview as an outpatient Have the patient follow-up with cardiology as an outpatient  Zachary Lovins D Naeema Patlan 07/23/2017, 4:43 PM

## 2017-07-23 NOTE — ED Notes (Signed)
Preparing pt for transport.

## 2017-07-23 NOTE — Progress Notes (Signed)
Went over discharge instruction including medication and follow-up appointment with the patient and daughter. Discontinue peripheral IV and telemetry monitor. Will call volunteer for transport.

## 2017-07-23 NOTE — ED Notes (Signed)
Patient transported to 249 

## 2017-07-23 NOTE — Care Management Obs Status (Signed)
MEDICARE OBSERVATION STATUS NOTIFICATION   Patient Details  Name: Mary Madden MRN: [id number] Date of Birth: [birth year]   Medicare Observation Status Notification Given:  Yes Notice signed, one given to patient and the other to HIM for scanning    Eber Hong, RN 07/23/2017, 12:09 PM

## 2017-07-24 LAB — BLOOD GAS, VENOUS
Acid-Base Excess: 2 mmol/L (ref 0.0–2.0)
BICARBONATE: 28.7 mmol/L — AB (ref 20.0–28.0)
FIO2: 0.21
PH VEN: 7.35 (ref 7.250–7.430)
Patient temperature: 37
pCO2, Ven: 52 mmHg (ref 44.0–60.0)
pO2, Ven: UNDETERMINED mmHg (ref 32.0–45.0)

## 2017-07-24 LAB — T3, FREE: T3, Free: 2.6 pg/mL (ref 2.0–4.4)

## 2018-05-04 IMAGING — CT CT HEAD W/O CM
4 series · 16 of 47 positions shown, 18 images · non-contrast
Comparison: 10/27/2013

CLINICAL DATA: Altered level of consciousness

EXAM:
CT HEAD WITHOUT CONTRAST
TECHNIQUE: Contiguous axial images were obtained from the base of the skull
through the vertex without intravenous contrast.

[Series 2: head wo · axial · 0.44mm/px · z∈[-132,-27]mm · 7 of 29 slices shown, 9 images]
[im 4/29  brain]
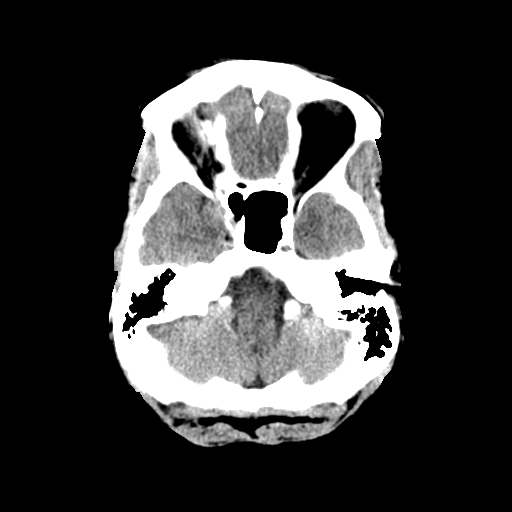
[im 4/29  bone]
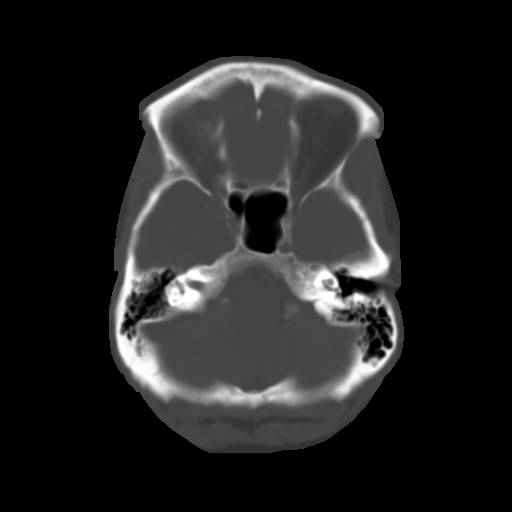
[im 8/29  brain]
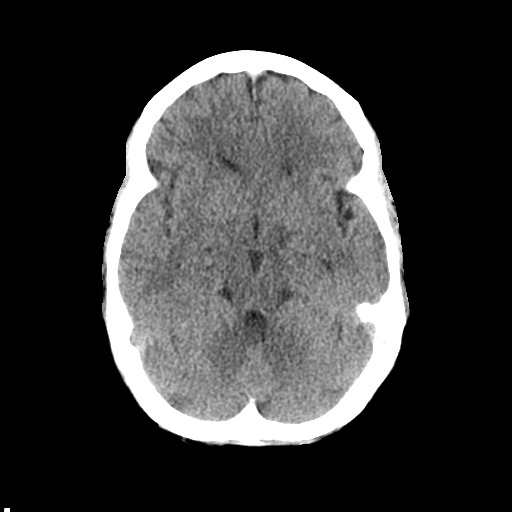
[im 11/29  brain]
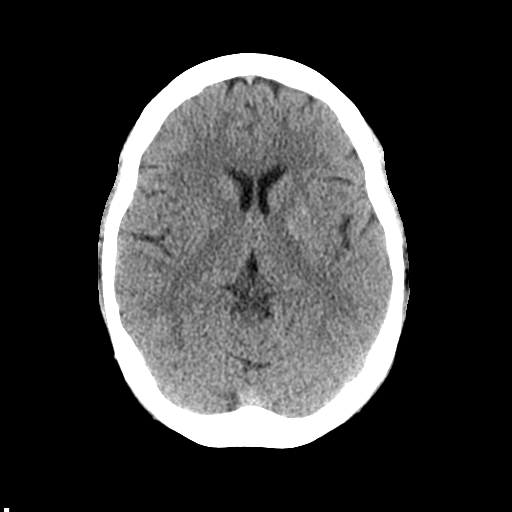
[im 15/29  brain]
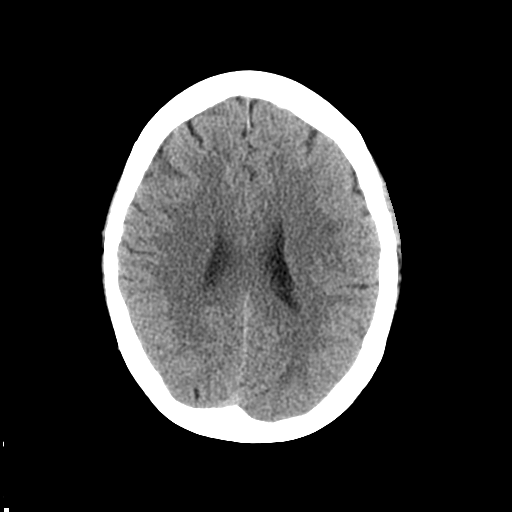
[im 18/29  brain]
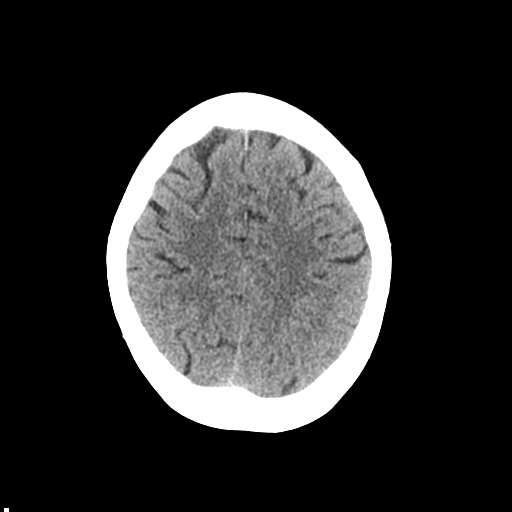
[im 18/29  bone]
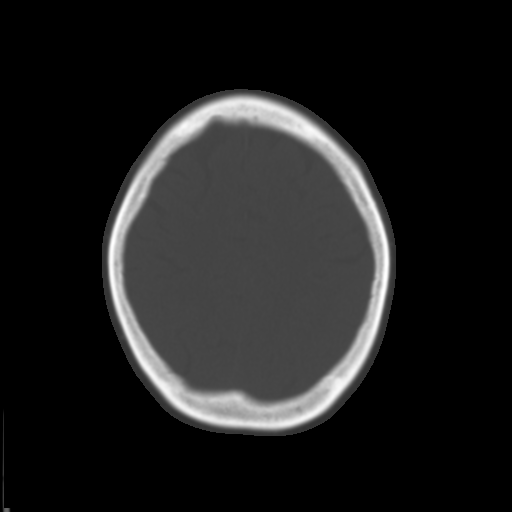
[im 22/29  brain]
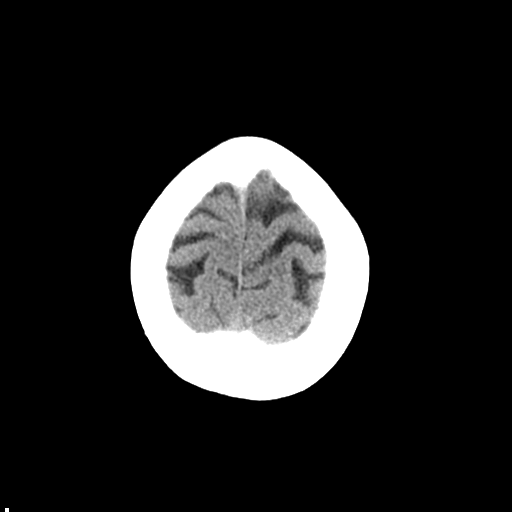
[im 25/29  brain]
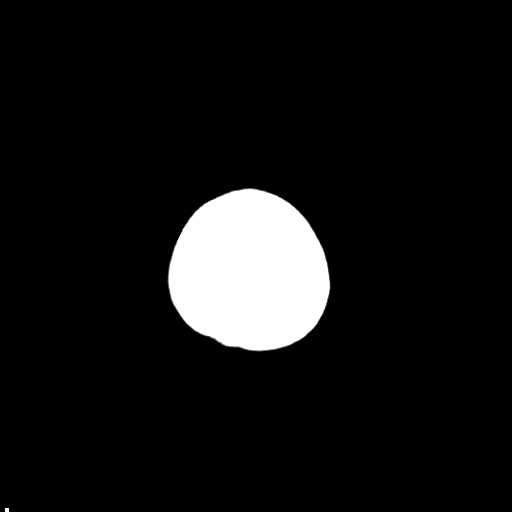

[Series 3: head bone · axial · 0.44mm/px · z∈[-133,-105]mm · 3 of 72 slices shown]
[im 8/72  bone]
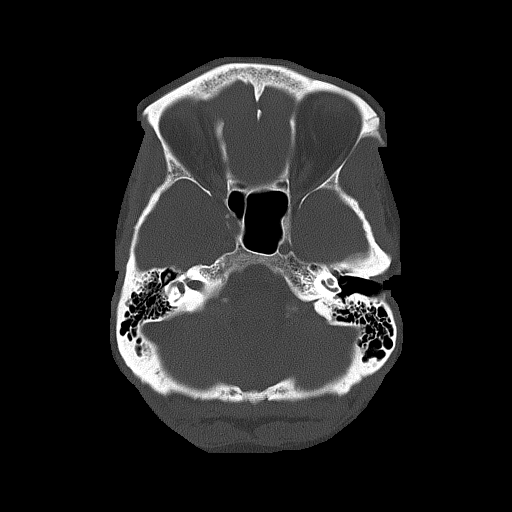
[im 15/72  bone]
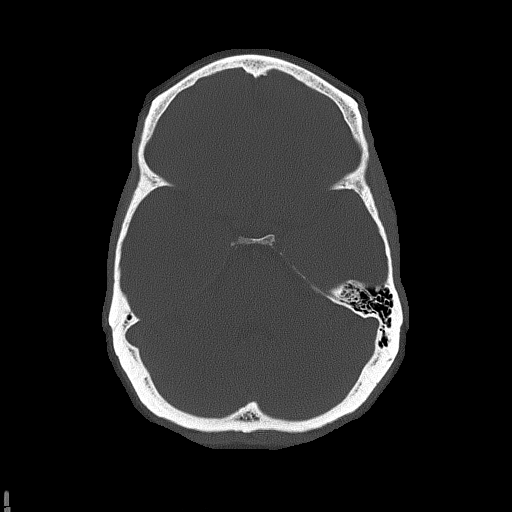
[im 22/72  bone]
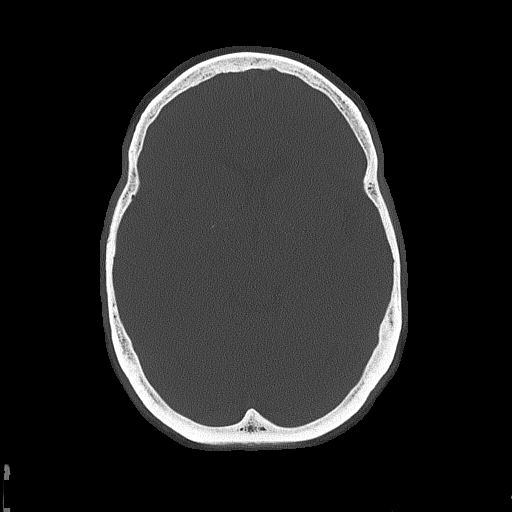

[Series 4: coronal soft tissue · coronal · 0.28mm/px · 3 of 55 slices shown]
[im 19/55  brain]
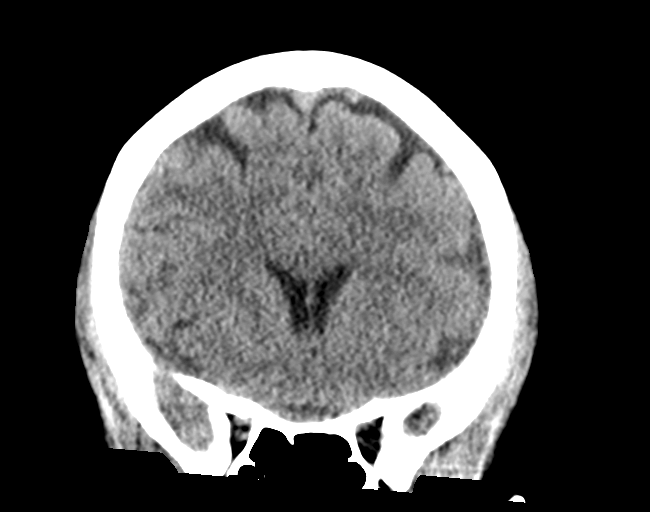
[im 25/55  brain]
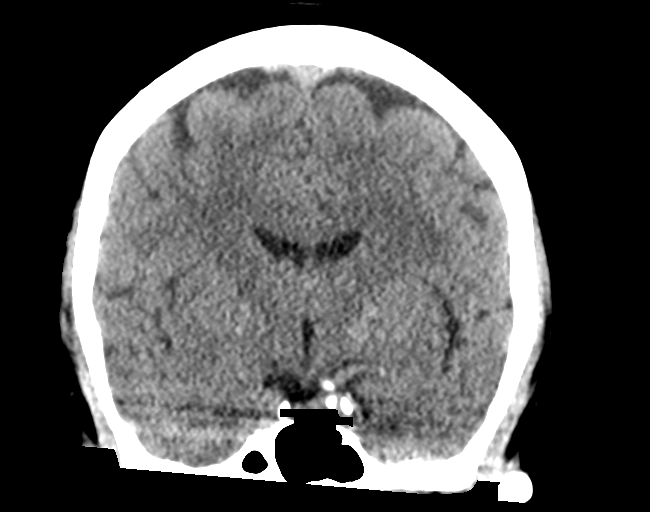
[im 31/55  brain]
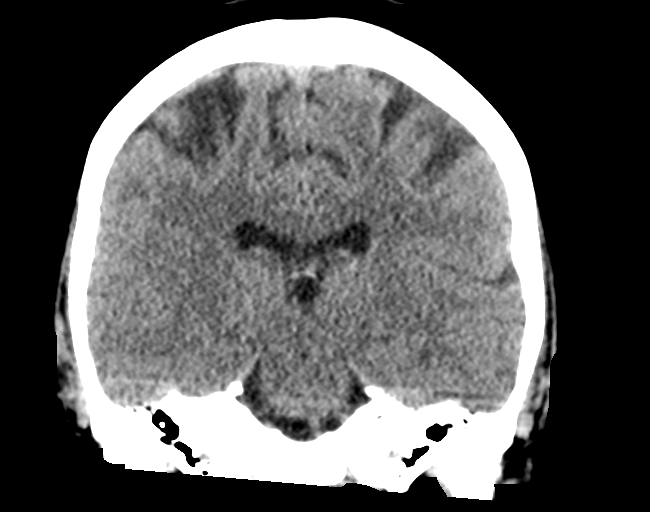

[Series 5: sagittal soft tissue · sagittal · 0.28mm/px · 3 of 48 slices shown]
[im 16/48  brain]
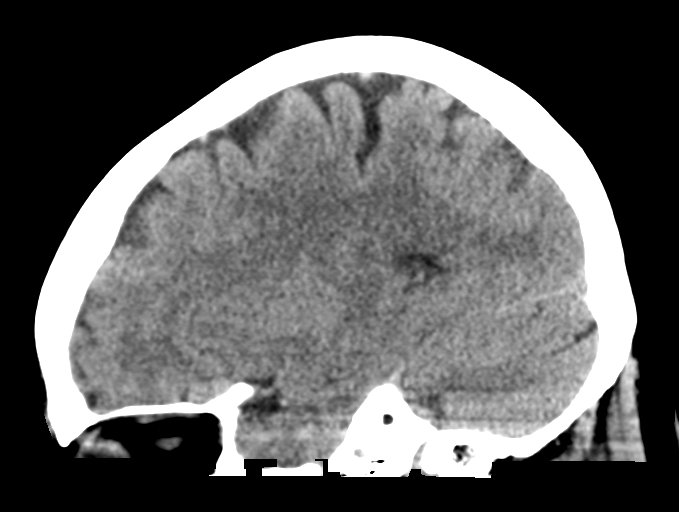
[im 24/48  brain]
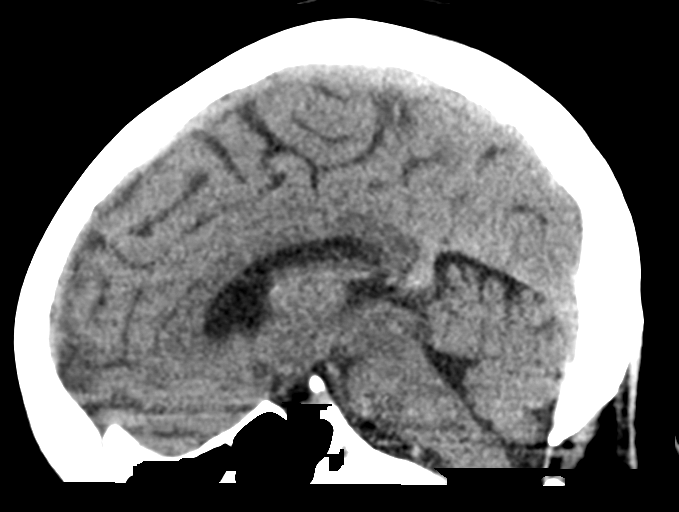
[im 32/48  brain]
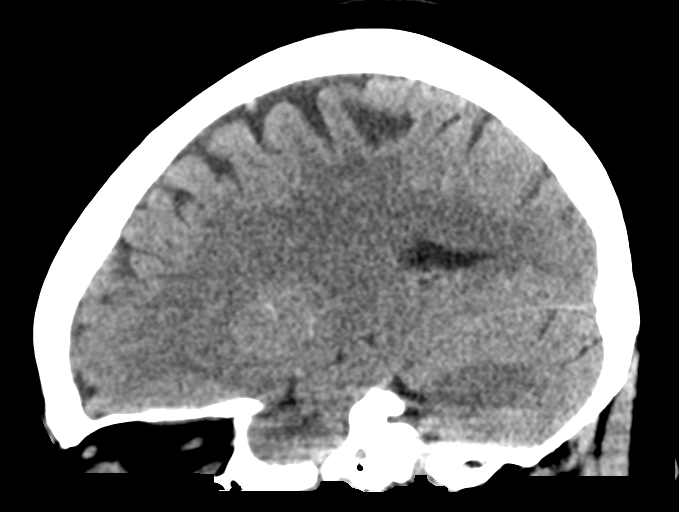

[16 of 47 positions shown; findings below may reference images not displayed]

FINDINGS: Brain: No acute territorial infarction, hemorrhage or intracranial
mass is seen. The ventricles are nonenlarged.

Vascular: No hyperdense vessels.  Carotid artery calcification.

Skull: Normal. Negative for fracture or focal lesion.

Sinuses/Orbits: No acute finding.

Other: None
IMPRESSION: No CT evidence for acute intracranial abnormality

## 2020-06-10 ENCOUNTER — Emergency Department: Payer: Medicare Other

## 2020-06-10 ENCOUNTER — Inpatient Hospital Stay
Admission: EM | Admit: 2020-06-10 | Discharge: 2020-06-14 | DRG: 689 | Disposition: A | Discharge status: Home Health | Payer: Medicare Other | Attending: Internal Medicine | Admitting: Internal Medicine

## 2020-06-10 ENCOUNTER — Other Ambulatory Visit: Payer: Self-pay

## 2020-06-10 DIAGNOSIS — R600 Localized edema: Secondary | ICD-10-CM | POA: Diagnosis present

## 2020-06-10 DIAGNOSIS — Z20822 Contact with and (suspected) exposure to covid-19: Secondary | ICD-10-CM | POA: Diagnosis present

## 2020-06-10 DIAGNOSIS — I252 Old myocardial infarction: Secondary | ICD-10-CM

## 2020-06-10 DIAGNOSIS — I248 Other forms of acute ischemic heart disease: Secondary | ICD-10-CM | POA: Diagnosis present

## 2020-06-10 DIAGNOSIS — E039 Hypothyroidism, unspecified: Secondary | ICD-10-CM | POA: Diagnosis present

## 2020-06-10 DIAGNOSIS — R7989 Other specified abnormal findings of blood chemistry: Secondary | ICD-10-CM

## 2020-06-10 DIAGNOSIS — R55 Syncope and collapse: Principal | ICD-10-CM

## 2020-06-10 DIAGNOSIS — F17210 Nicotine dependence, cigarettes, uncomplicated: Secondary | ICD-10-CM | POA: Diagnosis present

## 2020-06-10 DIAGNOSIS — Z91041 Radiographic dye allergy status: Secondary | ICD-10-CM

## 2020-06-10 DIAGNOSIS — R4182 Altered mental status, unspecified: Secondary | ICD-10-CM

## 2020-06-10 DIAGNOSIS — R000 Tachycardia, unspecified: Secondary | ICD-10-CM | POA: Diagnosis present

## 2020-06-10 DIAGNOSIS — G9341 Metabolic encephalopathy: Secondary | ICD-10-CM

## 2020-06-10 DIAGNOSIS — I10 Essential (primary) hypertension: Secondary | ICD-10-CM | POA: Diagnosis present

## 2020-06-10 DIAGNOSIS — I951 Orthostatic hypotension: Secondary | ICD-10-CM | POA: Diagnosis present

## 2020-06-10 DIAGNOSIS — E038 Other specified hypothyroidism: Secondary | ICD-10-CM | POA: Diagnosis present

## 2020-06-10 DIAGNOSIS — N390 Urinary tract infection, site not specified: Secondary | ICD-10-CM | POA: Diagnosis not present

## 2020-06-10 DIAGNOSIS — R778 Other specified abnormalities of plasma proteins: Secondary | ICD-10-CM

## 2020-06-10 HISTORY — DX: Hypothyroidism, unspecified: E03.9

## 2020-06-10 LAB — BASIC METABOLIC PANEL
Anion gap: 7 (ref 5–15)
BUN: 12 mg/dL (ref 8–23)
CO2: 28 mmol/L (ref 22–32)
Calcium: 8.1 mg/dL — ABNORMAL LOW (ref 8.9–10.3)
Chloride: 105 mmol/L (ref 98–111)
Creatinine, Ser: 0.78 mg/dL (ref 0.44–1.00)
GFR calc Af Amer: >60 mL/min (ref >60)
GFR calc non Af Amer: >60 mL/min (ref >60)
Glucose, Bld: 126 mg/dL — ABNORMAL HIGH (ref 70–99)
Potassium: 3.8 mmol/L (ref 3.5–5.1)
Sodium: 140 mmol/L (ref 135–145)

## 2020-06-10 LAB — CBC
HCT: 33.8 % — ABNORMAL LOW (ref 36.0–46.0)
Hemoglobin: 10.8 g/dL — ABNORMAL LOW (ref 12.0–15.0)
MCH: 26.3 pg (ref 26.0–34.0)
MCHC: 32 g/dL (ref 30.0–36.0)
MCV: 82.4 fL (ref 80.0–100.0)
Platelets: 317 10*3/uL (ref 150–400)
RBC: 4.1 MIL/uL (ref 3.87–5.11)
RDW: 14.2 % (ref 11.5–15.5)
WBC: 11.1 10*3/uL — ABNORMAL HIGH (ref 4.0–10.5)
nRBC: 0 % (ref 0.0–0.2)

## 2020-06-10 LAB — URINALYSIS, COMPLETE (UACMP) WITH MICROSCOPIC
Bilirubin Urine: NEGATIVE
Glucose, UA: NEGATIVE mg/dL
Ketones, ur: NEGATIVE mg/dL
Nitrite: NEGATIVE
Protein, ur: 30 mg/dL — AB
Specific Gravity, Urine: 1.027 (ref 1.005–1.030)
pH: 5 (ref 5.0–8.0)

## 2020-06-10 MED ORDER — SODIUM CHLORIDE 0.9 % IV BOLUS
1000.0000 mL | Freq: Once | INTRAVENOUS | Status: AC
  Administered 2020-06-11: 1000 mL via INTRAVENOUS

## 2020-06-10 NOTE — ED Notes (Signed)
Pt states she remembers eating breakfast then getting nauseated and passing out. Pt poor historian, seems to have difficulty concentrating on answers.

## 2020-06-10 NOTE — ED Triage Notes (Signed)
First nurse note- [facility] brought pt from home.  Here for syncope. Swelling to feet per EMS. VSS with EMS, CBG 94

## 2020-06-10 NOTE — ED Triage Notes (Signed)
Pt arrives via [facility], pt appears confused and slow to respond, says that she passed out and was found in her dining area she thinks. Pt c/o back pain, pt denies drinking etoh and states the year is 2020 pt unable to state who the president is

## 2020-06-10 NOTE — ED Provider Notes (Signed)
[facility] Emergency Department Provider Note   ____________________________________________   First MD Initiated Contact with Patient 06/10/20 2308     (approximate)  I have reviewed the triage vital signs and the nursing notes.   HISTORY  Chief Complaint Loss of Consciousness  Level V caveat: Limited by confusion History provided by daughter  HPI Mary Madden is a 64 y.o. female brought to the ED via EMS from home with a chief complaint of syncope and confusion.  Daughter states patient was sitting and passed out 2-3 times.  States patient did not strike the floor.  Patient complaining of low back pain.  Daughter states patient has seemed altered since passing out.  Patient states she generally felt unwell all day today but is unable to specify.  Denies fever, cough, shortness of breath, abdominal pain, diarrhea.  Did complain of chest pain and daughter states after syncope patient had emesis of clear liquid x1.  Has not had her Covid vaccinations.      Past Medical History:  Diagnosis Date  . MI (myocardial infarction) (HCC)   . Thyroid disease     Patient Active Problem List   Diagnosis Date Noted  . AKI (acute kidney injury) (HCC) 07/22/2017  . Hypothyroidism 07/22/2017  . Syncope and collapse 07/22/2017    Prior to Admission medications   Medication Sig Start Date End Date Taking? Authorizing Provider  acetaminophen (TYLENOL) 325 MG tablet Take 1 tablet (325 mg total) by mouth every 6 (six) hours as needed for mild pain (or Fever >/= 101). 07/23/17   Ramonita Lab, MD    Allergies Contrast media [iodinated diagnostic agents]  Family History  Family history unknown: Yes    Social History Social History   Tobacco Use  . Smoking status: Current Every Day Smoker  . Smokeless tobacco: Never Used  Substance Use Topics  . Alcohol use: No  . Drug use: No    Review of Systems  Constitutional: No fever/chills Eyes: No visual  changes. ENT: No sore throat. Cardiovascular: Denies chest pain. Respiratory: Denies shortness of breath. Gastrointestinal: No abdominal pain.  No nausea, no vomiting.  No diarrhea.  No constipation. Genitourinary: Negative for dysuria. Musculoskeletal: Negative for back pain. Skin: Negative for rash. Neurological: Positive for altered mentation.  Positive for syncope.  Negative for headaches, focal weakness or numbness.   ____________________________________________   PHYSICAL EXAM:  VITAL SIGNS: ED Triage Vitals  Enc Vitals Group     BP 06/10/20 1457 133/60     Pulse Rate 06/10/20 1457 (!) 107     Resp 06/10/20 1457 16     Temp 06/10/20 1457 99.7 F (37.6 C)     Temp Source 06/10/20 1457 Oral     SpO2 06/10/20 1457 98 %     Weight 06/10/20 1459 150 lb (68 kg)     Height 06/10/20 1459 5\' 6"  (1.676 m)     Head Circumference --      Peak Flow --      Pain Score 06/10/20 1458 4     Pain Loc --      Pain Edu? --      Excl. in GC? --     Constitutional: Alert and oriented.  Appears older than stated age and in no acute distress. Eyes: Conjunctivae are normal. PERRL. EOMI. Head: Atraumatic. Nose: No congestion/rhinnorhea. Mouth/Throat: Mucous membranes are mildly dry.   Neck: No stridor.   Cardiovascular: Normal rate, regular rhythm. Grossly normal heart sounds.  Good  peripheral circulation. Respiratory: Normal respiratory effort.  No retractions. Lungs CTAB. Gastrointestinal: Soft and nontender to light or deep palpation. No distention. No abdominal bruits. No CVA tenderness. Musculoskeletal: No lower extremity tenderness nor edema.  No joint effusions. Neurologic: Alert and oriented to person and place.  Normal speech and language. No gross focal neurologic deficits are appreciated. MAEx4. Skin:  Skin is warm, dry and intact. No rash noted.  No petechiae. Psychiatric: Mood and affect are normal. Speech and behavior are  normal.  ____________________________________________   LABS (all labs ordered are listed, but only abnormal results are displayed)  Labs Reviewed  BASIC METABOLIC PANEL - Abnormal; Notable for the following components:      Result Value   Glucose, Bld 126 (*)    Calcium 8.1 (*)    All other components within normal limits  CBC - Abnormal; Notable for the following components:   WBC 11.1 (*)    Hemoglobin 10.8 (*)    HCT 33.8 (*)    All other components within normal limits  URINALYSIS, COMPLETE (UACMP) WITH MICROSCOPIC - Abnormal; Notable for the following components:   Color, Urine AMBER (*)    APPearance CLOUDY (*)    Hgb urine dipstick MODERATE (*)    Protein, ur 30 (*)    Leukocytes,Ua LARGE (*)    Bacteria, UA MANY (*)    All other components within normal limits  CULTURE, BLOOD (ROUTINE X 2)  CULTURE, BLOOD (ROUTINE X 2)  URINE CULTURE  SARS CORONAVIRUS 2 BY RT PCR (HOSPITAL ORDER, PERFORMED IN [facility])  LACTIC ACID, PLASMA  AMMONIA  HEPATIC FUNCTION PANEL  LIPASE, BLOOD  ETHANOL  TSH  T4, FREE  CBG MONITORING, ED  TROPONIN I (HIGH SENSITIVITY)   ____________________________________________  EKG  ED ECG REPORT I, Tan Clopper J, the attending physician, personally viewed and interpreted this ECG.   Date: 06/11/2020  EKG Time: 1456  Rate: 107  Rhythm: sinus tachycardia  Axis: Normal  Intervals:none  ST&T Change: Nonspecific  ____________________________________________  RADIOLOGY  ED MD interpretation: No ICH; no acute cardiopulmonary process  Official radiology report(s): CT Head Wo Contrast  Result Date: 06/10/2020 CLINICAL DATA:  Delirium EXAM: CT HEAD WITHOUT CONTRAST TECHNIQUE: Contiguous axial images were obtained from the base of the skull through the vertex without intravenous contrast. COMPARISON:  July 22, 2017 FINDINGS: Brain: No evidence of acute territorial infarction, hemorrhage, hydrocephalus,extra-axial  collection or mass lesion/mass effect. Normal gray-white differentiation. Ventricles are normal in size and contour. Vascular: No hyperdense vessel or unexpected calcification. Skull: The skull is intact. No fracture or focal lesion identified. Sinuses/Orbits: The visualized paranasal sinuses and mastoid air cells are clear. The orbits and globes intact. Other: None IMPRESSION: No acute intracranial abnormality. Electronically Signed   By: Jonna Clark M.D.   On: 06/10/2020 15:33   DG Chest Port 1 View  Result Date: 06/10/2020 CLINICAL DATA:  Syncope EXAM: PORTABLE CHEST 1 VIEW COMPARISON:  07/22/2017 chest radiograph and prior. FINDINGS: No focal consolidation. No pneumothorax or pleural effusion. Cardiomediastinal silhouette within normal limits. No acute osseous abnormality. Right upper quadrant surgical clips. IMPRESSION: No acute airspace disease. Electronically Signed   By: Stana Bunting M.D.   On: 06/10/2020 23:28    ____________________________________________   PROCEDURES  Procedure(s) performed (including Critical Care):  .1-3 Lead EKG Interpretation Performed by: Irean Hong, MD Authorized by: Irean Hong, MD     Interpretation: abnormal     ECG rate:  104   ECG  rate assessment: tachycardic     Rhythm: sinus tachycardia     Ectopy: none     Conduction: normal   Comments:     Patient placed on cardiac monitor to evaluate for arrhythmias     ____________________________________________   INITIAL IMPRESSION / ASSESSMENT AND PLAN / ED COURSE  As part of my medical decision making, I reviewed the following data within the electronic MEDICAL RECORD NUMBER Nursing notes reviewed and incorporated, Labs reviewed, EKG interpreted, Old chart reviewed, Radiograph reviewed, Discussed with admitting physician and Notes from prior ED visits     Mary Madden was evaluated in Emergency Department on 06/11/2020 for the symptoms described in the history of present illness. She was  evaluated in the context of the global COVID-19 pandemic, which necessitated consideration that the patient might be at risk for infection with the SARS-CoV-2 virus that causes COVID-19. Institutional protocols and algorithms that pertain to the evaluation of patients at risk for COVID-19 are in a state of rapid change based on information released by regulatory bodies including the CDC and federal and state organizations. These policies and algorithms were followed during the patient's care in the ED.    64 year old female presenting with recurrent syncope and altered mentation. Differential diagnosis includes, but is not limited to, alcohol, illicit or prescription medications, or other toxic ingestion; intracranial pathology such as stroke or intracerebral hemorrhage; fever or infectious causes including sepsis; hypoxemia and/or hypercarbia; uremia; trauma; endocrine related disorders such as diabetes, hypoglycemia, and thyroid-related diseases; hypertensive encephalopathy; etc.  Laboratory results demonstrate leukocyte positive UTI.  Will initiate IV fluid hydration, IV Rocephin.  Will discuss with hospitalist services for admission.      ____________________________________________   FINAL CLINICAL IMPRESSION(S) / ED DIAGNOSES  Final diagnoses:  Syncope, unspecified syncope type  Altered mental status, unspecified altered mental status type  Urinary tract infection without hematuria, site unspecified     ED Discharge Orders    None       Note:  This document was prepared using Dragon voice recognition software and may include unintentional dictation errors.   Irean Hong, MD 06/11/20 [phone]

## 2020-06-11 ENCOUNTER — Encounter: Payer: Self-pay | Admitting: Internal Medicine

## 2020-06-11 DIAGNOSIS — I252 Old myocardial infarction: Secondary | ICD-10-CM | POA: Diagnosis not present

## 2020-06-11 DIAGNOSIS — R55 Syncope and collapse: Secondary | ICD-10-CM | POA: Diagnosis present

## 2020-06-11 DIAGNOSIS — R000 Tachycardia, unspecified: Secondary | ICD-10-CM | POA: Diagnosis present

## 2020-06-11 DIAGNOSIS — R600 Localized edema: Secondary | ICD-10-CM | POA: Diagnosis present

## 2020-06-11 DIAGNOSIS — G9341 Metabolic encephalopathy: Secondary | ICD-10-CM | POA: Diagnosis present

## 2020-06-11 DIAGNOSIS — R7989 Other specified abnormal findings of blood chemistry: Secondary | ICD-10-CM

## 2020-06-11 DIAGNOSIS — N390 Urinary tract infection, site not specified: Secondary | ICD-10-CM | POA: Diagnosis present

## 2020-06-11 DIAGNOSIS — I10 Essential (primary) hypertension: Secondary | ICD-10-CM | POA: Diagnosis present

## 2020-06-11 DIAGNOSIS — E038 Other specified hypothyroidism: Secondary | ICD-10-CM | POA: Diagnosis present

## 2020-06-11 DIAGNOSIS — I248 Other forms of acute ischemic heart disease: Secondary | ICD-10-CM | POA: Diagnosis present

## 2020-06-11 DIAGNOSIS — Z91041 Radiographic dye allergy status: Secondary | ICD-10-CM | POA: Diagnosis not present

## 2020-06-11 DIAGNOSIS — I951 Orthostatic hypotension: Secondary | ICD-10-CM | POA: Diagnosis present

## 2020-06-11 DIAGNOSIS — F17210 Nicotine dependence, cigarettes, uncomplicated: Secondary | ICD-10-CM | POA: Diagnosis present

## 2020-06-11 DIAGNOSIS — Z20822 Contact with and (suspected) exposure to covid-19: Secondary | ICD-10-CM | POA: Diagnosis present

## 2020-06-11 DIAGNOSIS — R778 Other specified abnormalities of plasma proteins: Secondary | ICD-10-CM

## 2020-06-11 LAB — COMPREHENSIVE METABOLIC PANEL
ALT: 78 U/L — ABNORMAL HIGH (ref 0–44)
AST: 151 U/L — ABNORMAL HIGH (ref 15–41)
Albumin: 2 g/dL — ABNORMAL LOW (ref 3.5–5.0)
Alkaline Phosphatase: 64 U/L (ref 38–126)
Anion gap: 7 (ref 5–15)
BUN: 11 mg/dL (ref 8–23)
CO2: 25 mmol/L (ref 22–32)
Calcium: 7.5 mg/dL — ABNORMAL LOW (ref 8.9–10.3)
Chloride: 105 mmol/L (ref 98–111)
Creatinine, Ser: 0.66 mg/dL (ref 0.44–1.00)
GFR calc Af Amer: >60 mL/min (ref >60)
GFR calc non Af Amer: >60 mL/min (ref >60)
Glucose, Bld: 111 mg/dL — ABNORMAL HIGH (ref 70–99)
Potassium: 3.7 mmol/L (ref 3.5–5.1)
Sodium: 137 mmol/L (ref 135–145)
Total Bilirubin: 0.7 mg/dL (ref 0.3–1.2)
Total Protein: 5.6 g/dL — ABNORMAL LOW (ref 6.5–8.1)

## 2020-06-11 LAB — LACTIC ACID, PLASMA: Lactic Acid, Venous: 1.1 mmol/L (ref 0.5–1.9)

## 2020-06-11 LAB — CREATININE, SERUM
Creatinine, Ser: 0.48 mg/dL (ref 0.44–1.00)
GFR calc Af Amer: >60 mL/min (ref >60)
GFR calc non Af Amer: >60 mL/min (ref >60)

## 2020-06-11 LAB — CBC
HCT: 26 % — ABNORMAL LOW (ref 36.0–46.0)
Hemoglobin: 8.4 g/dL — ABNORMAL LOW (ref 12.0–15.0)
MCH: 27 pg (ref 26.0–34.0)
MCHC: 32.3 g/dL (ref 30.0–36.0)
MCV: 83.6 fL (ref 80.0–100.0)
Platelets: 232 10*3/uL (ref 150–400)
RBC: 3.11 MIL/uL — ABNORMAL LOW (ref 3.87–5.11)
RDW: 14.2 % (ref 11.5–15.5)
WBC: 8.1 10*3/uL (ref 4.0–10.5)
nRBC: 0 % (ref 0.0–0.2)

## 2020-06-11 LAB — TROPONIN I (HIGH SENSITIVITY)
Troponin I (High Sensitivity): 85 ng/L — ABNORMAL HIGH (ref <18)
Troponin I (High Sensitivity): 105 ng/L (ref <18)
Troponin I (High Sensitivity): 78 ng/L — ABNORMAL HIGH (ref <18)
Troponin I (High Sensitivity): 78 ng/L — ABNORMAL HIGH (ref <18)

## 2020-06-11 LAB — HEPATITIS PANEL, ACUTE
HCV Ab: NONREACTIVE
Hep A IgM: NONREACTIVE
Hep B C IgM: NONREACTIVE
Hepatitis B Surface Ag: NONREACTIVE

## 2020-06-11 LAB — HEPATIC FUNCTION PANEL
ALT: 93 U/L — ABNORMAL HIGH (ref 0–44)
AST: 172 U/L — ABNORMAL HIGH (ref 15–41)
Albumin: 2.3 g/dL — ABNORMAL LOW (ref 3.5–5.0)
Alkaline Phosphatase: 66 U/L (ref 38–126)
Bilirubin, Direct: <0.1 mg/dL (ref 0.0–0.2)
Total Bilirubin: 0.6 mg/dL (ref 0.3–1.2)
Total Protein: 6.7 g/dL (ref 6.5–8.1)

## 2020-06-11 LAB — CBC WITH DIFFERENTIAL/PLATELET
Abs Immature Granulocytes: 0.06 10*3/uL (ref 0.00–0.07)
Basophils Absolute: 0 10*3/uL (ref 0.0–0.1)
Basophils Relative: 0 %
Eosinophils Absolute: 0.4 10*3/uL (ref 0.0–0.5)
Eosinophils Relative: 4 %
HCT: 29.7 % — ABNORMAL LOW (ref 36.0–46.0)
Hemoglobin: 9.4 g/dL — ABNORMAL LOW (ref 12.0–15.0)
Immature Granulocytes: 1 %
Lymphocytes Relative: 15 %
Lymphs Abs: 1.4 10*3/uL (ref 0.7–4.0)
MCH: 26.3 pg (ref 26.0–34.0)
MCHC: 31.6 g/dL (ref 30.0–36.0)
MCV: 83 fL (ref 80.0–100.0)
Monocytes Absolute: 0.6 10*3/uL (ref 0.1–1.0)
Monocytes Relative: 7 %
Neutro Abs: 6.7 10*3/uL (ref 1.7–7.7)
Neutrophils Relative %: 73 %
Platelets: 291 10*3/uL (ref 150–400)
RBC: 3.58 MIL/uL — ABNORMAL LOW (ref 3.87–5.11)
RDW: 14.2 % (ref 11.5–15.5)
WBC: 9.2 10*3/uL (ref 4.0–10.5)
nRBC: 0 % (ref 0.0–0.2)

## 2020-06-11 LAB — ETHANOL: Alcohol, Ethyl (B): <10 mg/dL (ref <10)

## 2020-06-11 LAB — TSH: TSH: 15.559 u[IU]/mL — ABNORMAL HIGH (ref 0.350–4.500)

## 2020-06-11 LAB — SARS CORONAVIRUS 2 BY RT PCR (HOSPITAL ORDER, PERFORMED IN ~~LOC~~ HOSPITAL LAB): SARS Coronavirus 2: NEGATIVE

## 2020-06-11 LAB — T4, FREE: Free T4: 0.89 ng/dL (ref 0.61–1.12)

## 2020-06-11 LAB — ACETAMINOPHEN LEVEL: Acetaminophen (Tylenol), Serum: <10 ug/mL — ABNORMAL LOW (ref 10–30)

## 2020-06-11 LAB — LIPASE, BLOOD: Lipase: 23 U/L (ref 11–51)

## 2020-06-11 LAB — AMMONIA: Ammonia: 10 umol/L (ref 9–35)

## 2020-06-11 LAB — HIV ANTIBODY (ROUTINE TESTING W REFLEX): HIV Screen 4th Generation wRfx: NONREACTIVE

## 2020-06-11 MED ORDER — ONDANSETRON HCL 4 MG/2ML IJ SOLN: 4.0000 mg | Freq: Four times a day (QID) | INTRAMUSCULAR | Status: DC | PRN

## 2020-06-11 MED ORDER — ACETAMINOPHEN 325 MG PO TABS: 650.0000 mg | ORAL_TABLET | Freq: Four times a day (QID) | ORAL | Status: DC | PRN

## 2020-06-11 MED ORDER — ACETAMINOPHEN 650 MG RE SUPP: 650.0000 mg | Freq: Four times a day (QID) | RECTAL | Status: DC | PRN

## 2020-06-11 MED ORDER — SODIUM CHLORIDE 0.9 % IV SOLN
1.0000 g | Freq: Once | INTRAVENOUS | Status: AC
  Administered 2020-06-11: 1 g via INTRAVENOUS
  Filled 2020-06-11: qty 10

## 2020-06-11 MED ORDER — ENOXAPARIN SODIUM 40 MG/0.4ML ~~LOC~~ SOLN
40.0000 mg | SUBCUTANEOUS | Status: DC
  Administered 2020-06-11 – 2020-06-14 (×4): 40 mg via SUBCUTANEOUS
  Filled 2020-06-11 (×3): qty 0.4

## 2020-06-11 MED ORDER — DIPHENHYDRAMINE HCL 50 MG/ML IJ SOLN
12.5000 mg | Freq: Once | INTRAMUSCULAR | Status: AC
  Administered 2020-06-11: 12.5 mg via INTRAVENOUS
  Filled 2020-06-11: qty 1

## 2020-06-11 MED ORDER — SODIUM CHLORIDE 0.9 % IV SOLN: INTRAVENOUS | Status: DC

## 2020-06-11 MED ORDER — SODIUM CHLORIDE 0.9 % IV SOLN
1.0000 g | INTRAVENOUS | Status: DC
  Administered 2020-06-11 – 2020-06-12 (×2): 1 g via INTRAVENOUS
  Filled 2020-06-11 (×2): qty 10

## 2020-06-11 MED ORDER — ONDANSETRON HCL 4 MG PO TABS: 4.0000 mg | ORAL_TABLET | Freq: Four times a day (QID) | ORAL | Status: DC | PRN

## 2020-06-11 NOTE — H&P (Signed)
History and Physical    [name]:[id number] DOB: [birth year] DOA: 06/10/2020  PCP: Patient, No Pcp Per   Patient coming from: home  I have personally briefly reviewed patient's old medical records in [facility]  Chief Complaint: Passed out x3, confusion  HPI: Mary Madden is a 64 y.o. female with medical history significant for hypothyroidism who was brought to the emergency room after she was witnessed to have lost consciousness/passed out 3 times while sitting in a chair she finally came around but has been confused since. She did not fall history is provided by daughter who witnessed the episode and due to patient's confusion. Patient had been in her usual state of health until earlier in the day when she had generalized malaise. She denied specific symptoms such as nausea, vomiting, abdominal pain, dysuria or change in bowel habits. Denied cough fever chills or shortness of breath and denied headache or visual disturbance. Had no chest pain or shortness of breath ED Course: Upon arrival she was mildly tachycardic at 107 with otherwise normal vitals. Blood work significant for leukocytosis of 11,000 with pyuria on urinalysis, normal lactic acid. She had slight elevation in liver enzymes with AST 172 and ALT 93. Troponin 85. EtOH level was less than 10. she was started on Rocephin, IV hydration. Hospitalist consulted for admission.  Review of Systems: Unable to obtain due to confusion   Past Medical History:  Diagnosis Date  . MI (myocardial infarction) (HCC)   . Thyroid disease     Past Surgical History:  Procedure Laterality Date  . NO PAST SURGERIES    Unable to obtain due to confusion   reports that she has been smoking. She has never used smokeless tobacco. She reports that she does not drink alcohol and does not use drugs.  Allergies  Allergen Reactions  . Contrast Media [Iodinated Diagnostic Agents] Other (See Comments)    Reaction: unknown    Family  History  Family history unknown: Yes  Unable to obtain due to confusion   Prior to Admission medications   Medication Sig Start Date End Date Taking? Authorizing Provider  acetaminophen (TYLENOL) 325 MG tablet Take 1 tablet (325 mg total) by mouth every 6 (six) hours as needed for mild pain (or Fever >/= 101). 07/23/17   Ramonita Lab, MD    Physical Exam: Vitals:   06/10/20 1830 06/10/20 2058 06/10/20 2243 06/10/20 2300  BP: (!) 130/58 108/60 (!) 145/69 (!) 180/77  Pulse: (!) 106 (!) 105 (!) 104 (!) 103  Resp: 16 18 (!) 24 (!) 26  Temp:  99.6 F (37.6 C)    TempSrc:  Oral    SpO2: 99% 98% 98%   Weight:      Height:         Vitals:   06/10/20 1830 06/10/20 2058 06/10/20 2243 06/10/20 2300  BP: (!) 130/58 108/60 (!) 145/69 (!) 180/77  Pulse: (!) 106 (!) 105 (!) 104 (!) 103  Resp: 16 18 (!) 24 (!) 26  Temp:  99.6 F (37.6 C)    TempSrc:  Oral    SpO2: 99% 98% 98%   Weight:      Height:          Constitutional:  Awake but oriented to person only.  Keeps repeating the same question HEENT:      Head: Normocephalic and atraumatic.         Eyes: PERLA, EOMI, Conjunctivae are normal. Sclera is non-icteric.  Mouth/Throat: Mucous membranes are moist.       Neck: Supple with no signs of meningismus. Cardiovascular: Regular rate and rhythm. No murmurs, gallops, or rubs. 2+ symmetrical distal pulses are present . No JVD. No LE edema Respiratory: Respiratory effort normal .Lungs sounds clear bilaterally. No wheezes, crackles, or rhonchi.  Gastrointestinal: Soft, non tender, and non distended with positive bowel sounds. No rebound or guarding. Genitourinary: No CVA tenderness. Musculoskeletal: Nontender with normal range of motion in all extremities. No cyanosis, or erythema of extremities. Neurologic: . Face is symmetric. Moving all extremities. No gross focal neurologic deficits . Skin: Skin is warm, dry.  No rash or ulcers Psychiatric: Mood and affect are normal  Labs on  Admission: I have personally reviewed following labs and imaging studies  CBC: Recent Labs  Lab 06/10/20 1502  WBC 11.1*  HGB 10.8*  HCT 33.8*  MCV 82.4  PLT 317   Basic Metabolic Panel: Recent Labs  Lab 06/10/20 1502  NA 140  K 3.8  CL 105  CO2 28  GLUCOSE 126*  BUN 12  CREATININE 0.78  CALCIUM 8.1*   GFR: Estimated Creatinine Clearance: 66.5 mL/min (by C-G formula based on SCr of 0.78 mg/dL). Liver Function Tests: Recent Labs  Lab 06/10/20 2359  AST 172*  ALT 93*  ALKPHOS 66  BILITOT 0.6  PROT 6.7  ALBUMIN 2.3*   Recent Labs  Lab 06/10/20 2359  LIPASE 23   Recent Labs  Lab 06/10/20 2359  AMMONIA 10   Coagulation Profile: No results for input(s): INR, PROTIME in the last 168 hours. Cardiac Enzymes: No results for input(s): CKTOTAL, CKMB, CKMBINDEX, TROPONINI in the last 168 hours. BNP (last 3 results) No results for input(s): PROBNP in the last 8760 hours. HbA1C: No results for input(s): HGBA1C in the last 72 hours. CBG: No results for input(s): GLUCAP in the last 168 hours. Lipid Profile: No results for input(s): CHOL, HDL, LDLCALC, TRIG, CHOLHDL, LDLDIRECT in the last 72 hours. Thyroid Function Tests: No results for input(s): TSH, T4TOTAL, FREET4, T3FREE, THYROIDAB in the last 72 hours. Anemia Panel: No results for input(s): VITAMINB12, FOLATE, FERRITIN, TIBC, IRON, RETICCTPCT in the last 72 hours. Urine analysis:    Component Value Date/Time   COLORURINE AMBER (A) 06/10/2020 1502   APPEARANCEUR CLOUDY (A) 06/10/2020 1502   APPEARANCEUR Cloudy 10/27/2013 1351   LABSPEC 1.027 06/10/2020 1502   LABSPEC 1.028 10/27/2013 1351   PHURINE 5.0 06/10/2020 1502   GLUCOSEU NEGATIVE 06/10/2020 1502   GLUCOSEU Negative 10/27/2013 1351   HGBUR MODERATE (A) 06/10/2020 1502   BILIRUBINUR NEGATIVE 06/10/2020 1502   BILIRUBINUR Negative 10/27/2013 1351   KETONESUR NEGATIVE 06/10/2020 1502   PROTEINUR 30 (A) 06/10/2020 1502   NITRITE NEGATIVE 06/10/2020  1502   LEUKOCYTESUR LARGE (A) 06/10/2020 1502   LEUKOCYTESUR Trace 10/27/2013 1351    Radiological Exams on Admission: CT Head Wo Contrast  Result Date: 06/10/2020 CLINICAL DATA:  Delirium EXAM: CT HEAD WITHOUT CONTRAST TECHNIQUE: Contiguous axial images were obtained from the base of the skull through the vertex without intravenous contrast. COMPARISON:  July 22, 2017 FINDINGS: Brain: No evidence of acute territorial infarction, hemorrhage, hydrocephalus,extra-axial collection or mass lesion/mass effect. Normal gray-white differentiation. Ventricles are normal in size and contour. Vascular: No hyperdense vessel or unexpected calcification. Skull: The skull is intact. No fracture or focal lesion identified. Sinuses/Orbits: The visualized paranasal sinuses and mastoid air cells are clear. The orbits and globes intact. Other: None IMPRESSION: No acute intracranial abnormality. Electronically Signed  By: Jonna Clark M.D.   On: 06/10/2020 15:33   DG Chest Port 1 View  Result Date: 06/10/2020 CLINICAL DATA:  Syncope EXAM: PORTABLE CHEST 1 VIEW COMPARISON:  07/22/2017 chest radiograph and prior. FINDINGS: No focal consolidation. No pneumothorax or pleural effusion. Cardiomediastinal silhouette within normal limits. No acute osseous abnormality. Right upper quadrant surgical clips. IMPRESSION: No acute airspace disease. Electronically Signed   By: Stana Bunting M.D.   On: 06/10/2020 23:28    EKG: Independently reviewed. Interpretation : Sinus tachycardia with nonspecific ST-T wave changes  Assessment/Plan 64 year old female with history of hypothyroidism presenting with 3 syncopal episodes with work-up showing UTI. Elevated LFTs and troponin also found on work-up    Syncope -Suspect vasovagal/orthostatic hypotension secondary to UTI -IV hydration -Cardiac monitoring    UTI (urinary tract infection) -IV Rocephin -Follow cultures    Acute metabolic encephalopathy -Neurologic  checks -Suspect related to UTI above    Hypothyroidism -Continue home meds    Elevated LFTs -EtOH level less than 10 -Hepatitis panel    Elevated troponin -Suspect demand ischemia given no reports of chest pain and EKG is nonacute -Continue to cycle.    DVT prophylaxis: Lovenox  Code Status: full code  Family Communication:  none  Disposition Plan: Back to previous home environment Consults called: none  Status: Observation    Andris Baumann MD [facility]     06/11/2020, 1:16 AM

## 2020-06-11 NOTE — Plan of Care (Signed)
  Problem: Education: Goal: Knowledge of General Education information will improve Description Including pain rating scale, medication(s)/side effects and non-pharmacologic comfort measures Outcome: Progressing   

## 2020-06-11 NOTE — Progress Notes (Signed)
HPI: Mary Madden is a 64 y.o. female with medical history significant for hypothyroidism who was brought to the emergency room after she was witnessed to have lost consciousness/passed out 3 times while sitting in a chair she finally came around but has been confused since. She did not fall history is provided by daughter who witnessed the episode and due to patient's confusion. Patient had been in her usual state of health until earlier in the day when she had generalized malaise. She denied specific symptoms such as nausea, vomiting, abdominal pain, dysuria or change in bowel habits. Denied cough fever chills or shortness of breath and denied headache or visual disturbance. Had no chest pain or shortness of breath ED Course: Upon arrival she was mildly tachycardic at 107 with otherwise normal vitals. Blood work significant for leukocytosis of 11,000 with pyuria on urinalysis, normal lactic acid. She had slight elevation in liver enzymes with AST 172 and ALT 93. Troponin 85. EtOH level was less than 10. she was started on Rocephin, IV hydration. Hospitalist consulted for admission.  06/11/20: Seen and examined at her bedside.  She is alert but confused.  Denies any pain.  UA positive for pyuria, urine culture in process.  Blood cultures negative to date.  Continue to follow cultures.  Please refer to H&P dictated by my partner Dr. Para March on 06/11/2020 for further details of the assessment and plan.

## 2020-06-11 NOTE — ED Notes (Signed)
REPORT PROVIDED TO Inetta Fermo, RN

## 2020-06-12 ENCOUNTER — Inpatient Hospital Stay
Admit: 2020-06-12 | Discharge: 2020-06-12 | Disposition: A | Discharge status: Home / Self Care | Payer: Medicare Other | Attending: Internal Medicine | Admitting: Internal Medicine

## 2020-06-12 LAB — COMPREHENSIVE METABOLIC PANEL
ALT: 74 U/L — ABNORMAL HIGH (ref 0–44)
AST: 128 U/L — ABNORMAL HIGH (ref 15–41)
Albumin: 2 g/dL — ABNORMAL LOW (ref 3.5–5.0)
Alkaline Phosphatase: 61 U/L (ref 38–126)
Anion gap: 7 (ref 5–15)
BUN: 6 mg/dL — ABNORMAL LOW (ref 8–23)
CO2: 24 mmol/L (ref 22–32)
Calcium: 7.5 mg/dL — ABNORMAL LOW (ref 8.9–10.3)
Chloride: 108 mmol/L (ref 98–111)
Creatinine, Ser: 0.88 mg/dL (ref 0.44–1.00)
GFR calc Af Amer: >60 mL/min (ref >60)
GFR calc non Af Amer: >60 mL/min (ref >60)
Glucose, Bld: 103 mg/dL — ABNORMAL HIGH (ref 70–99)
Potassium: 3.7 mmol/L (ref 3.5–5.1)
Sodium: 139 mmol/L (ref 135–145)
Total Bilirubin: 0.5 mg/dL (ref 0.3–1.2)
Total Protein: 5.8 g/dL — ABNORMAL LOW (ref 6.5–8.1)

## 2020-06-12 LAB — URINE CULTURE

## 2020-06-12 LAB — T4, FREE: Free T4: 0.86 ng/dL (ref 0.61–1.12)

## 2020-06-12 LAB — CBC WITH DIFFERENTIAL/PLATELET
Abs Immature Granulocytes: 0.11 10*3/uL — ABNORMAL HIGH (ref 0.00–0.07)
Basophils Absolute: 0 10*3/uL (ref 0.0–0.1)
Basophils Relative: 0 %
Eosinophils Absolute: 0.3 10*3/uL (ref 0.0–0.5)
Eosinophils Relative: 3 %
HCT: 29.7 % — ABNORMAL LOW (ref 36.0–46.0)
Hemoglobin: 9.7 g/dL — ABNORMAL LOW (ref 12.0–15.0)
Immature Granulocytes: 1 %
Lymphocytes Relative: 18 %
Lymphs Abs: 1.7 10*3/uL (ref 0.7–4.0)
MCH: 26.9 pg (ref 26.0–34.0)
MCHC: 32.7 g/dL (ref 30.0–36.0)
MCV: 82.3 fL (ref 80.0–100.0)
Monocytes Absolute: 0.7 10*3/uL (ref 0.1–1.0)
Monocytes Relative: 7 %
Neutro Abs: 7 10*3/uL (ref 1.7–7.7)
Neutrophils Relative %: 71 %
Platelets: 279 10*3/uL (ref 150–400)
RBC: 3.61 MIL/uL — ABNORMAL LOW (ref 3.87–5.11)
RDW: 14.1 % (ref 11.5–15.5)
WBC: 9.9 10*3/uL (ref 4.0–10.5)
nRBC: 0 % (ref 0.0–0.2)

## 2020-06-12 LAB — MAGNESIUM: Magnesium: 1.9 mg/dL (ref 1.7–2.4)

## 2020-06-12 MED ORDER — LEVOTHYROXINE SODIUM 50 MCG PO TABS
50.0000 ug | ORAL_TABLET | Freq: Every day | ORAL | Status: DC
  Administered 2020-06-12 – 2020-06-14 (×3): 50 ug via ORAL
  Filled 2020-06-12 (×3): qty 1

## 2020-06-12 MED ORDER — LEVOTHYROXINE SODIUM 25 MCG PO TABS: 25.0000 ug | ORAL_TABLET | Freq: Every day | ORAL | Status: DC

## 2020-06-12 MED ORDER — LABETALOL HCL 100 MG PO TABS
100.0000 mg | ORAL_TABLET | Freq: Two times a day (BID) | ORAL | Status: DC
  Administered 2020-06-12 – 2020-06-14 (×5): 100 mg via ORAL
  Filled 2020-06-12 (×6): qty 1

## 2020-06-12 NOTE — Progress Notes (Signed)
*  PRELIMINARY RESULTS* Echocardiogram 2D Echocardiogram has been performed.  Garrel Ridgel Reaghan Kawa 06/12/2020, 2:42 PM

## 2020-06-12 NOTE — Progress Notes (Signed)
   06/12/20 0036  Assess: MEWS Score  Temp 98.7 F (37.1 C)  BP (!) 150/68  Pulse Rate (!) 123  Resp 18  Level of Consciousness Alert  SpO2 97 %  O2 Device Room Air  Patient Activity (if Appropriate) In bed  Assess: MEWS Score  MEWS Temp 0  MEWS Systolic 0  MEWS Pulse 2  MEWS RR 0  MEWS LOC 0  MEWS Score 2  MEWS Score Color Yellow  Assess: if the MEWS score is Yellow or Red  Were vital signs taken at a resting state? Yes  Focused Assessment No change from prior assessment  Early Detection of Sepsis Score *See Row Information* Low  MEWS guidelines implemented *See Row Information* Yes  Treat  MEWS Interventions Escalated (See documentation below)  Take Vital Signs  Increase Vital Sign Frequency  Yellow: Q 2hr X 2 then Q 4hr X 2, if remains yellow, continue Q 4hrs  Escalate  MEWS: Escalate Yellow: discuss with charge nurse/RN and consider discussing with provider and RRT  Notify: Charge Nurse/RN  Name of Charge Nurse/RN Notified Teresa, RN  Date Charge Nurse/RN Notified 06/12/20  Time Charge Nurse/RN Notified 0000  Notify: Provider  Provider Name/Title  Manuela Schwartz, NP)  Date Provider Notified 06/12/20  Time Provider Notified [phone]  Notification Type  (Secure chat)  Notification Reason Change in status  Response No new orders  Date of Provider Response 06/12/20  Time of Provider Response 0050  Document  Patient Outcome Other (Comment) (Patient stable.)

## 2020-06-12 NOTE — Evaluation (Signed)
Physical Therapy Evaluation Patient Details Name: Mary Madden MRN: [id number] DOB: [birth year] Today's Date: 06/12/2020   History of Present Illness  Patient is a 64 y/o F that presents after losing consciousness several times, noted to have elevated LFTs and UTI. She has had some confusion throughout this admission.  Clinical Impression  Patient is a 64 y/o F that presents with deconditioning after episodes of LOC at home. She is a poor historian at this time, has had intermittent confusion throughout this admission. She is not able to provide much detail on her PLOF, but is able to ambulate a household distance this date without AD and very minimal HHA. She is noted to have some lateral sway in gait, appears to be living with her daughter who can provide some assistance as needed. At this time, she would likely benefit from HHPT referral given her mild balance deficits.     Follow Up Recommendations Home health PT    Equipment Recommendations       Recommendations for Other Services       Precautions / Restrictions Precautions Precautions: Fall Restrictions Weight Bearing Restrictions: No      Mobility  Bed Mobility Overal bed mobility: Independent             General bed mobility comments: No deficits observed.  Transfers Overall transfer level: Needs assistance Equipment used: None Transfers: Sit to/from Stand Sit to Stand: Min guard         General transfer comment: Guarding for balance, otherwise no deficits observed.  Ambulation/Gait Ambulation/Gait assistance: Min guard Gait Distance (Feet): 200 Feet Assistive device: None;1 person hand held assist Gait Pattern/deviations: WFL(Within Functional Limits)   Gait velocity interpretation: <1.31 ft/sec, indicative of household ambulator General Gait Details: Patient with lateral loss of balance intermittently while ambulating. Ambulates with intermittent HHA w/ no loss of balance, but requires frequent cuing  for directions.  Stairs            Wheelchair Mobility    Modified Rankin (Stroke Patients Only)       Balance Overall balance assessment: Needs assistance;Mild deficits observed, not formally tested Sitting-balance support: No upper extremity supported Sitting balance-Leahy Scale: Normal     Standing balance support: Single extremity supported Standing balance-Leahy Scale: Fair                               Pertinent Vitals/Pain Pain Assessment: Faces Faces Pain Scale: No hurt    Home Living Family/patient expects to be discharged to:: Private residence Living Arrangements: Children Available Help at Discharge: Family Type of Home: House                Prior Function Level of Independence: Independent         Comments: Patient is a poor historian at this time, was unable to provide much information on her PLOF or living set up.     Hand Dominance        Extremity/Trunk Assessment   Upper Extremity Assessment Upper Extremity Assessment: Overall WFL for tasks assessed    Lower Extremity Assessment Lower Extremity Assessment: Generalized weakness       Communication   Communication: No difficulties  Cognition Arousal/Alertness: Awake/alert Behavior During Therapy: WFL for tasks assessed/performed;Flat affect Overall Cognitive Status: Difficult to assess  General Comments: Patient intermittently did not follow commands, could not provide much information from subjective portion of the evaluation. No family members present.      General Comments      Exercises     Assessment/Plan    PT Assessment Patient needs continued PT services  PT Problem List Decreased strength;Decreased mobility;Decreased activity tolerance;Decreased cognition;Decreased balance;Decreased safety awareness       PT Treatment Interventions      PT Goals (Current goals can be found in the Care Plan  section)  Acute Rehab PT Goals Patient Stated Goal: To return home PT Goal Formulation: With patient Time For Goal Achievement: 06/26/20 Potential to Achieve Goals: Good    Frequency Min 2X/week   Barriers to discharge        Co-evaluation               AM-PAC PT "6 Clicks" Mobility  Outcome Measure Help needed turning from your back to your side while in a flat bed without using bedrails?: None Help needed moving from lying on your back to sitting on the side of a flat bed without using bedrails?: None Help needed moving to and from a bed to a chair (including a wheelchair)?: None Help needed standing up from a chair using your arms (e.g., wheelchair or bedside chair)?: A Little Help needed to walk in hospital room?: A Little Help needed climbing 3-5 steps with a railing? : A Little 6 Click Score: 21    End of Session Equipment Utilized During Treatment: Gait belt Activity Tolerance: Patient tolerated treatment well Patient left: with chair alarm set;in chair;with call bell/phone within reach Nurse Communication: Mobility status PT Visit Diagnosis: Difficulty in walking, not elsewhere classified (R26.2)    Time: 9476-5465 PT Time Calculation (min) (ACUTE ONLY): 11 min   Charges:   PT Evaluation $PT Eval Moderate Complexity: 1 Mod        Alva Garnet PT, DPT, CSCS    06/12/2020, 12:28 PM

## 2020-06-12 NOTE — Progress Notes (Signed)
PROGRESS NOTE  Mary Madden QIH:[id number] DOB: [birth year] DOA: 06/10/2020 PCP: Patient, No Pcp Per  HPI/Recap of past 24 hours: OVF:IEPPIRJJO [name] a 64 y.o.femalewith medical history significant forhypothyroidism who was brought to the emergency room after she was witnessed to have lost consciousness/passed out 3 times while sitting in a chair she finally came around but has been confused since. She did not fall history is provided by daughter who witnessed the episode and due to patient's confusion. Patient had been in her usual state of health until earlier in the day when she had generalized malaise. She denied specific symptoms such as nausea, vomiting, abdominal pain, dysuria or change in bowel habits. Denied cough fever chills or shortness of breath and denied headache or visual disturbance. Had no chest pain or shortness of breath ED Course:Upon arrival she was mildly tachycardic at 107 with otherwise normal vitals. Blood work significant for leukocytosis of 11,000 with pyuria on urinalysis, normal lactic acid. She had slight elevation in liver enzymes with AST 172 and ALT 93. Troponin 85. EtOH level was less than 10.she was started on Rocephin, IV hydration. Hospitalist consulted for admission.  06/12/20: Seen and examined at her bedside.    Son is present in the room.  She is more alert and interactive this morning.  Denies any dysuria and no abdominal pain at this time.  States dysuria has resolved.    Reports new onset a week ago of bilateral lower extremity edema.  Will obtain a 2D echo to further assess.  Presenting TSH elevated at >15.  Started on levothyroxine 50 mcg daily.  She denies any cardiopulmonary symptoms.   Assessment/Plan: Principal Problem:   Syncope Active Problems:   Hypothyroidism   UTI (urinary tract infection)   Acute metabolic encephalopathy   Elevated LFTs   Elevated troponin  Syncope, suspect vasovagal -Suspect vasovagal/orthostatic  hypotension secondary to UTI -Received IV hydration and IV antibiotics for presumed UTI -Continue cardiac monitoring    Presumptive UTI (urinary tract infection) -Self-reported she was symptomatic with dysuria and increasing frequency of urination Dysuria and frequency have now resolved Urine culture grew multiple species, recommended recollection Blood cultures negative to date We will continue IV Rocephin in the distal continue tomorrow for presumptive UTI   Bilateral lower extremity edema, possibly related to hypothyroidism TSH greater than 15 Self-reported onset a week ago 2D echo to further assess  Essential hypertension with sinus tachycardia No PTA hypertensives Start labetalol    Resolved acute metabolic encephalopathy -Suspect related to presumptive UTI     Hypothyroidism Elevated TSH greater than 15 Started levothyroxine 50 mcg daily Will need to follow-up with her primary care provider    Elevated LFTs -EtOH level less than 10 -Hepatitis panel nonreactive    Elevated troponin Troponin S peaked at 105 and trended down Denies any anginal symptoms     DVT prophylaxis: Lovenox subcu daily Code Status: full code  Family Communication:   Updated her son at bedside.  Consults called: none    Status is: Inpatient    Dispo:  Patient From: Home  Planned Disposition: Home with Health Care Svc  Expected discharge date: 06/13/20  Medically stable for discharge: No, ongoing work-up.         Objective: Vitals:   06/12/20 0100 06/12/20 0811 06/12/20 1109 06/12/20 1514  BP: (!) 145/68 (!) 147/62 139/60 (!) 152/65  Pulse: (!) 123 (!) 110 (!) 110 (!) 110  Resp: 20 20 18 20   Temp: 98.8 F (37.1 C) 98.5  F (36.9 C) 98.2 F (36.8 C) 98 F (36.7 C)  TempSrc: Oral Oral Oral Oral  SpO2: 97% 99% 100% 98%  Weight:      Height:        Intake/Output Summary (Last 24 hours) at 06/12/2020 1529 Last data filed at 06/12/2020 1352 Gross per 24 hour   Intake 480 ml  Output --  Net 480 ml   Filed Weights   06/10/20 1459  Weight: 68 kg    Exam:  . General: 64 y.o. year-old female well developed well nourished in no acute distress.  Alert and oriented x3. . Cardiovascular: Regular rate and rhythm with no rubs or gallops.  No thyromegaly or JVD noted.   Marland Kitchen Respiratory: Clear to auscultation with no wheezes or rales. Good inspiratory effort. . Abdomen: Soft nontender nondistended with normal bowel sounds x4 quadrants. . Musculoskeletal: 2+ pitting edema in lower extremities bilaterally. Marland Kitchen Psychiatry: Mood is appropriate for condition and setting   Data Reviewed: CBC: Recent Labs  Lab 06/10/20 1502 06/11/20 0311 06/11/20 0515 06/12/20 0543  WBC 11.1* 8.1 9.2 9.9  NEUTROABS  --   --  6.7 7.0  HGB 10.8* 8.4* 9.4* 9.7*  HCT 33.8* 26.0* 29.7* 29.7*  MCV 82.4 83.6 83.0 82.3  PLT 317 232 291 279   Basic Metabolic Panel: Recent Labs  Lab 06/10/20 1502 06/11/20 0311 06/11/20 0515 06/12/20 0543  NA 140  --  137 139  K 3.8  --  3.7 3.7  CL 105  --  105 108  CO2 28  --  25 24  GLUCOSE 126*  --  111* 103*  BUN 12  --  11 6*  CREATININE 0.78 0.48 0.66 0.88  CALCIUM 8.1*  --  7.5* 7.5*  MG  --   --   --  1.9   GFR: Estimated Creatinine Clearance: 60.5 mL/min (by C-G formula based on SCr of 0.88 mg/dL). Liver Function Tests: Recent Labs  Lab 06/10/20 2359 06/11/20 0515 06/12/20 0543  AST 172* 151* 128*  ALT 93* 78* 74*  ALKPHOS 66 64 61  BILITOT 0.6 0.7 0.5  PROT 6.7 5.6* 5.8*  ALBUMIN 2.3* 2.0* 2.0*   Recent Labs  Lab 06/10/20 2359  LIPASE 23   Recent Labs  Lab 06/10/20 2359  AMMONIA 10   Coagulation Profile: No results for input(s): INR, PROTIME in the last 168 hours. Cardiac Enzymes: No results for input(s): CKTOTAL, CKMB, CKMBINDEX, TROPONINI in the last 168 hours. BNP (last 3 results) No results for input(s): PROBNP in the last 8760 hours. HbA1C: No results for input(s): HGBA1C in the last 72  hours. CBG: No results for input(s): GLUCAP in the last 168 hours. Lipid Profile: No results for input(s): CHOL, HDL, LDLCALC, TRIG, CHOLHDL, LDLDIRECT in the last 72 hours. Thyroid Function Tests: Recent Labs    06/10/20 2359 06/10/20 2359 06/12/20 0543  TSH 15.559*  --   --   FREET4 0.89   < > 0.86   < > = values in this interval not displayed.   Anemia Panel: No results for input(s): VITAMINB12, FOLATE, FERRITIN, TIBC, IRON, RETICCTPCT in the last 72 hours. Urine analysis:    Component Value Date/Time   COLORURINE AMBER (A) 06/10/2020 1502   APPEARANCEUR CLOUDY (A) 06/10/2020 1502   APPEARANCEUR Cloudy 10/27/2013 1351   LABSPEC 1.027 06/10/2020 1502   LABSPEC 1.028 10/27/2013 1351   PHURINE 5.0 06/10/2020 1502   GLUCOSEU NEGATIVE 06/10/2020 1502   GLUCOSEU Negative 10/27/2013 1351  HGBUR MODERATE (A) 06/10/2020 1502   BILIRUBINUR NEGATIVE 06/10/2020 1502   BILIRUBINUR Negative 10/27/2013 1351   KETONESUR NEGATIVE 06/10/2020 1502   PROTEINUR 30 (A) 06/10/2020 1502   NITRITE NEGATIVE 06/10/2020 1502   LEUKOCYTESUR LARGE (A) 06/10/2020 1502   LEUKOCYTESUR Trace 10/27/2013 1351   Sepsis Labs: @LABRCNTIP (procalcitonin:4,lacticidven:4)  ) Recent Results (from the past 240 hour(s))  Urine culture     Status: Abnormal   Collection Time: 06/10/20  3:02 PM   Specimen: Urine, Random  Result Value Ref Range Status   Specimen Description   Final    URINE, RANDOM Performed at [facility], [address], [place], [place]    Special Requests   Final    NONE Performed at [facility], [address] [place], [place]    Culture MULTIPLE SPECIES PRESENT, SUGGEST RECOLLECTION (A)  Final   Report Status 06/12/2020 FINAL  Final  Culture, blood (routine x 2)     Status: None (Preliminary result)   Collection Time: 06/10/20 11:59 PM   Specimen: BLOOD  Result Value Ref Range Status   Specimen Description BLOOD LEFT ANTECUBITAL  Final     Special Requests   Final    BOTTLES DRAWN AEROBIC AND ANAEROBIC Blood Culture adequate volume   Culture   Final    NO GROWTH 1 DAY Performed at [facility], [address] [place], [place]    Report Status PENDING  Incomplete  Culture, blood (routine x 2)     Status: None (Preliminary result)   Collection Time: 06/10/20 11:59 PM   Specimen: BLOOD  Result Value Ref Range Status   Specimen Description BLOOD BLOOD RIGHT HAND  Final   Special Requests   Final    BOTTLES DRAWN AEROBIC AND ANAEROBIC Blood Culture adequate volume   Culture   Final    NO GROWTH 1 DAY Performed at [facility], [address], [place], [place]    Report Status PENDING  Incomplete  SARS Coronavirus 2 by RT PCR (hospital order, performed in [facility] hospital lab) Nasopharyngeal Nasopharyngeal Swab     Status: None   Collection Time: 06/10/20 11:59 PM   Specimen: Nasopharyngeal Swab  Result Value Ref Range Status   SARS Coronavirus 2 NEGATIVE NEGATIVE Final    Comment: (NOTE) SARS-CoV-2 target nucleic acids are NOT DETECTED.  The SARS-CoV-2 RNA is generally detectable in upper and lower respiratory specimens during the acute phase of infection. The lowest concentration of SARS-CoV-2 viral copies this assay can detect is 250 copies / mL. A negative result does not preclude SARS-CoV-2 infection and should not be used as the sole basis for treatment or other patient management decisions.  A negative result may occur with improper specimen collection / handling, submission of specimen other than nasopharyngeal swab, presence of viral mutation(s) within the areas targeted by this assay, and inadequate number of viral copies (<250 copies / mL). A negative result must be combined with clinical observations, patient history, and epidemiological information.  Fact Sheet for Patients:   06/12/20  Fact Sheet for Healthcare  Providers: BoilerBrush.com.cy  This test is not yet approved or  cleared by the https://pope.com/ FDA and has been authorized for detection and/or diagnosis of SARS-CoV-2 by FDA under an Emergency Use Authorization (EUA).  This EUA will remain in effect (meaning this test can be used) for the duration of the COVID-19 declaration under Section 564(b)(1) of the Act, 21 U.S.C. section 360bbb-3(b)(1),  unless the authorization is terminated or revoked sooner.  Performed at [facility], [address] [place], [place] [address]       Studies: No results found.  Scheduled Meds: . enoxaparin (LOVENOX) injection  40 mg Subcutaneous Q24H  . levothyroxine  50 mcg Oral Q0600    Continuous Infusions: . cefTRIAXone (ROCEPHIN)  IV 1 g (06/11/20 2257)     LOS: 1 day     Darlin Droparole N Murry Diaz, MD [facility] Pager [phone]  If 7PM-7AM, please contact night-coverage [email or web address] Password [email or web address] 06/12/2020, 3:29 PM

## 2020-06-13 MED ORDER — SODIUM CHLORIDE 0.9 % IV SOLN
1.0000 g | INTRAVENOUS | Status: AC
  Administered 2020-06-13: 1 g via INTRAVENOUS
  Filled 2020-06-13: qty 1

## 2020-06-13 MED ORDER — PHENOL 1.4 % MT LIQD
1.0000 | OROMUCOSAL | Status: DC | PRN
  Administered 2020-06-13: 1 via OROMUCOSAL
  Filled 2020-06-13: qty 177

## 2020-06-13 NOTE — Progress Notes (Addendum)
PROGRESS NOTE  Mary Madden RFF:[id number] DOB: 1956-01-15 DOA: 06/10/2020 PCP: Patient, No Pcp Per  HPI/Recap of past 24 hours: JTT:[id number] [name] a 64 y.o.femalewho was brought to the emergency room after she was witnessed to have lost consciousness/passed out 3 times while sitting in a chair she finally came around but was confused. She did not fall.  History is provided by daughter who witnessed the episode and confusion. She had generalized malaise prior to this.  Later reported dysuria and increased urinary frequency.  ED Course:Upon arrival she was mildly tachycardic at 107. Blood work significant for leukocytosis of 11,000 with pyuria on urinalysis. She had elevation in liver enzymes with AST 172 and ALT 93. Troponin 85.  Was started on Rocephin, IV hydration. Hospitalist consulted for admission.  Reported new onset of bilateral lower extremity edema, started a week ago.  2D echo results are pending.  Elevated TSH on presentation greater than 15.  Started on levothyroxine 50 mcg daily.    06/13/20: Seen and examined.  Denies any cardiopulmonary symptoms at the time of this exam.  Generalized weakness.  PT recommended home health PT.  Fall precautions in place.  TOC assisting with home health services.   Assessment/Plan: Principal Problem:   Syncope Active Problems:   Hypothyroidism   UTI (urinary tract infection)   Acute metabolic encephalopathy   Elevated LFTs   Elevated troponin  Syncope, suspect vasovagal -Suspect vasovagal/orthostatic hypotension secondary to UTI -Received IV hydration and IV antibiotics for symptomatic pyuria -No recurrence while hospitalized.  Subclinical hypothyroidism Presented with TSH greater than 15 and normal free T4 At high risk for developing overt hypothyroidism Started treatment with levothyroxine 50 mcg daily Will need to follow-up with her primary care provider and repeat TSH in 6 weeks  Elevated troponin, suspect demand  ischemia in the setting of tachycardia Sinus tachycardia on EKG Peaked at 105 and trended down She denies any anginal symptoms.   Presumptive UTI (urinary tract infection) -Self-reported she was symptomatic with dysuria and increasing frequency of urination Dysuria and frequency have now resolved, received 3 days of IV Rocephin Urine culture grew multiple species, recommended recollection Blood cultures negative to date  Bilateral lower extremity edema, possibly related to hypothyroidism TSH greater than 15 Self-reported onset a week ago of bilateral lower extremity edema 2D echo results are pending  Essential hypertension with sinus tachycardia No prior to admission hypertensives Continue labetalol  Resolved acute metabolic encephalopathy -Suspect related to presumptive UTI     Elevated LFTs -EtOH level less than 10 -Hepatitis panel nonreactive     DVT prophylaxis: Lovenox subcu daily Code Status: full code  Family Communication:   Updated her son at bedside.  Consults called: none    Status is: Inpatient    Dispo:  Patient From: Home  Planned Disposition: Home with Health Care Svc  Expected discharge date: 06/14/2020  Medically stable for discharge: No, ongoing work-up and treatment for hypothyroidism.         Objective: Vitals:   06/12/20 1109 06/12/20 1514 06/12/20 2335 06/13/20 0824  BP: 139/60 (!) 152/65 (!) 119/53 (!) 114/50  Pulse: (!) 110 (!) 110 (!) 110 (!) 104  Resp: 18 20 18 16   Temp: 98.2 F (36.8 C) 98 F (36.7 C) 98.7 F (37.1 C)   TempSrc: Oral Oral Oral   SpO2: 100% 98% 98% 97%  Weight:      Height:        Intake/Output Summary (Last 24 hours) at 06/13/2020 1447 Last data  filed at 06/13/2020 0944 Gross per 24 hour  Intake 480 ml  Output --  Net 480 ml   Filed Weights   06/10/20 1459  Weight: 68 kg    Exam:  . General: 64 y.o. year-old female well-developed well-nourished no acute distress.  Alert oriented x3.   . Cardiovascular: Tachycardic no rubs or gallops. Marland Kitchen Respiratory: Clear to auscultation no wheezes or rales.   . Abdomen: Soft nontender normal bowel sounds present. . Musculoskeletal: 1+ pitting edema in lower extremities bilaterally.   Marland Kitchen Psychiatry: Mood is appropriate for condition and setting.  Data Reviewed: CBC: Recent Labs  Lab 06/10/20 1502 06/11/20 0311 06/11/20 0515 06/12/20 0543  WBC 11.1* 8.1 9.2 9.9  NEUTROABS  --   --  6.7 7.0  HGB 10.8* 8.4* 9.4* 9.7*  HCT 33.8* 26.0* 29.7* 29.7*  MCV 82.4 83.6 83.0 82.3  PLT 317 232 291 279   Basic Metabolic Panel: Recent Labs  Lab 06/10/20 1502 06/11/20 0311 06/11/20 0515 06/12/20 0543  NA 140  --  137 139  K 3.8  --  3.7 3.7  CL 105  --  105 108  CO2 28  --  25 24  GLUCOSE 126*  --  111* 103*  BUN 12  --  11 6*  CREATININE 0.78 0.48 0.66 0.88  CALCIUM 8.1*  --  7.5* 7.5*  MG  --   --   --  1.9   GFR: Estimated Creatinine Clearance: 60.5 mL/min (by C-G formula based on SCr of 0.88 mg/dL). Liver Function Tests: Recent Labs  Lab 06/10/20 2359 06/11/20 0515 06/12/20 0543  AST 172* 151* 128*  ALT 93* 78* 74*  ALKPHOS 66 64 61  BILITOT 0.6 0.7 0.5  PROT 6.7 5.6* 5.8*  ALBUMIN 2.3* 2.0* 2.0*   Recent Labs  Lab 06/10/20 2359  LIPASE 23   Recent Labs  Lab 06/10/20 2359  AMMONIA 10   Coagulation Profile: No results for input(s): INR, PROTIME in the last 168 hours. Cardiac Enzymes: No results for input(s): CKTOTAL, CKMB, CKMBINDEX, TROPONINI in the last 168 hours. BNP (last 3 results) No results for input(s): PROBNP in the last 8760 hours. HbA1C: No results for input(s): HGBA1C in the last 72 hours. CBG: No results for input(s): GLUCAP in the last 168 hours. Lipid Profile: No results for input(s): CHOL, HDL, LDLCALC, TRIG, CHOLHDL, LDLDIRECT in the last 72 hours. Thyroid Function Tests: Recent Labs    06/10/20 2359 06/10/20 2359 06/12/20 0543  TSH 15.559*  --   --   FREET4 0.89   < > 0.86   <  > = values in this interval not displayed.   Anemia Panel: No results for input(s): VITAMINB12, FOLATE, FERRITIN, TIBC, IRON, RETICCTPCT in the last 72 hours. Urine analysis:    Component Value Date/Time   COLORURINE AMBER (A) 06/10/2020 1502   APPEARANCEUR CLOUDY (A) 06/10/2020 1502   APPEARANCEUR Cloudy 10/27/2013 1351   LABSPEC 1.027 06/10/2020 1502   LABSPEC 1.028 10/27/2013 1351   PHURINE 5.0 06/10/2020 1502   GLUCOSEU NEGATIVE 06/10/2020 1502   GLUCOSEU Negative 10/27/2013 1351   HGBUR MODERATE (A) 06/10/2020 1502   BILIRUBINUR NEGATIVE 06/10/2020 1502   BILIRUBINUR Negative 10/27/2013 1351   KETONESUR NEGATIVE 06/10/2020 1502   PROTEINUR 30 (A) 06/10/2020 1502   NITRITE NEGATIVE 06/10/2020 1502   LEUKOCYTESUR LARGE (A) 06/10/2020 1502   LEUKOCYTESUR Trace 10/27/2013 1351   Sepsis Labs: @LABRCNTIP (procalcitonin:4,lacticidven:4)  ) Recent Results (from the past 240 hour(s))  Urine culture  Status: Abnormal   Collection Time: 06/10/20  3:02 PM   Specimen: Urine, Random  Result Value Ref Range Status   Specimen Description   Final    URINE, RANDOM Performed at [facility], [address] [place], [place] [address]    Special Requests   Final    NONE Performed at [facility], [address] [place], [place] [address]    Culture MULTIPLE SPECIES PRESENT, SUGGEST RECOLLECTION (A)  Final   Report Status 06/12/2020 FINAL  Final  Culture, blood (routine x 2)     Status: None (Preliminary result)   Collection Time: 06/10/20 11:59 PM   Specimen: BLOOD  Result Value Ref Range Status   Specimen Description BLOOD LEFT ANTECUBITAL  Final   Special Requests   Final    BOTTLES DRAWN AEROBIC AND ANAEROBIC Blood Culture adequate volume   Culture   Final    NO GROWTH 2 DAYS Performed at [facility], [address], [place], [place] [address]    Report Status PENDING  Incomplete  Culture, blood (routine x 2)     Status: None (Preliminary  result)   Collection Time: 06/10/20 11:59 PM   Specimen: BLOOD  Result Value Ref Range Status   Specimen Description BLOOD BLOOD RIGHT HAND  Final   Special Requests   Final    BOTTLES DRAWN AEROBIC AND ANAEROBIC Blood Culture adequate volume   Culture   Final    NO GROWTH 2 DAYS Performed at [facility], [address] [place], [place] [address]    Report Status PENDING  Incomplete  SARS Coronavirus 2 by RT PCR (hospital order, performed in [facility] hospital lab) Nasopharyngeal Nasopharyngeal Swab     Status: None   Collection Time: 06/10/20 11:59 PM   Specimen: Nasopharyngeal Swab  Result Value Ref Range Status   SARS Coronavirus 2 NEGATIVE NEGATIVE Final    Comment: (NOTE) SARS-CoV-2 target nucleic acids are NOT DETECTED.  The SARS-CoV-2 RNA is generally detectable in upper and lower respiratory specimens during the acute phase of infection. The lowest concentration of SARS-CoV-2 viral copies this assay can detect is 250 copies / mL. A negative result does not preclude SARS-CoV-2 infection and should not be used as the sole basis for treatment or other patient management decisions.  A negative result may occur with improper specimen collection / handling, submission of specimen other than nasopharyngeal swab, presence of viral mutation(s) within the areas targeted by this assay, and inadequate number of viral copies (<250 copies / mL). A negative result must be combined with clinical observations, patient history, and epidemiological information.  Fact Sheet for Patients:   BoilerBrush.com.cy  Fact Sheet for Healthcare Providers: https://pope.com/  This test is not yet approved or  cleared by the [place] FDA and has been authorized for detection and/or diagnosis of SARS-CoV-2 by FDA under an Emergency Use Authorization (EUA).  This EUA will remain in effect (meaning this test can be used) for the duration  of the COVID-19 declaration under Section 564(b)(1) of the Act, 21 U.S.C. section 360bbb-3(b)(1), unless the authorization is terminated or revoked sooner.  Performed at [facility], [address], [place], [place] [address]       Studies: No results found.  Scheduled Meds: . enoxaparin (LOVENOX) injection  40 mg Subcutaneous Q24H  . labetalol  100 mg Oral BID  . levothyroxine  50 mcg Oral Q0600    Continuous Infusions: . cefTRIAXone (ROCEPHIN)  IV  LOS: 2 days     Mary Droparole N Natane Heward, MD [facility] Pager [phone]  If 7PM-7AM, please contact night-coverage [email or web address] Password [email or web address] 06/13/2020, 2:47 PM

## 2020-06-13 NOTE — Plan of Care (Signed)
  Problem: Education: Goal: Knowledge of General Education information will improve Description Including pain rating scale, medication(s)/side effects and non-pharmacologic comfort measures Outcome: Progressing   

## 2020-06-13 NOTE — Progress Notes (Signed)
Physical Therapy Treatment Patient Details Name: Mary Madden MRN: [id number] DOB: Apr 15, 1956 Today's Date: 06/13/2020    History of Present Illness Patient is a 64 y/o F that presents after losing consciousness several times, noted to have elevated LFTs and UTI. She has had some confusion throughout this admission.    PT Comments    Patient sleeping on arrival to room, intermittently confused but able to follow single step commands consistently. Patient needs supervision for safety and demonstrates no loss of balance with functional ambulation, turns, navigating obstacles, and 5 second single leg stance on BLE with unilateral UE support. Patient reports no dizziness with standing or upright activity. Recommend supervision and HHPT at home for safety and cognitive issues with confusion.    Follow Up Recommendations  Home health PT     Equipment Recommendations  None recommended by PT    Recommendations for Other Services       Precautions / Restrictions Precautions Precautions: Fall Restrictions Weight Bearing Restrictions: No    Mobility  Bed Mobility Overal bed mobility: Independent                Transfers Overall transfer level: Needs assistance Equipment used: None Transfers: Sit to/from Stand Sit to Stand: Supervision         General transfer comment: supervision for standing from toilet and from bed   Ambulation/Gait Ambulation/Gait assistance: Min guard Gait Distance (Feet): 40 Feet Assistive device: None Gait Pattern/deviations: Step-through pattern Gait velocity: decreased   General Gait Details: cues for safety and technique. no gross loss of balance noted. patient able to navigate around obstacles in room without loss of balance    Stairs             Wheelchair Mobility    Modified Rankin (Stroke Patients Only)       Balance           Standing balance support: No upper extremity supported Standing balance-Leahy Scale:  Good               High level balance activites: Direction changes;Turns (single leg stance with unilateral UE support )              Cognition Arousal/Alertness: Awake/alert Behavior During Therapy: WFL for tasks assessed/performed Overall Cognitive Status: No family/caregiver present to determine baseline cognitive functioning                                 General Comments: patient has confusion during session but is able to follow single step commands with extra time       Exercises      General Comments        Pertinent Vitals/Pain Pain Assessment: No/denies pain    Home Living                      Prior Function            PT Goals (current goals can now be found in the care plan section) Acute Rehab PT Goals Patient Stated Goal: to return home  PT Goal Formulation: With patient Time For Goal Achievement: 06/26/20 Potential to Achieve Goals: Good Progress towards PT goals: Progressing toward goals    Frequency    Min 2X/week      PT Plan Current plan remains appropriate    Co-evaluation              AM-PAC PT "  6 Clicks" Mobility   Outcome Measure  Help needed turning from your back to your side while in a flat bed without using bedrails?: None Help needed moving from lying on your back to sitting on the side of a flat bed without using bedrails?: None Help needed moving to and from a bed to a chair (including a wheelchair)?: None Help needed standing up from a chair using your arms (e.g., wheelchair or bedside chair)?: A Little Help needed to walk in hospital room?: A Little Help needed climbing 3-5 steps with a railing? : A Little 6 Click Score: 21    End of Session Equipment Utilized During Treatment: Gait belt Activity Tolerance: Patient tolerated treatment well Patient left: in chair;with call bell/phone within reach;with chair alarm set Nurse Communication: Mobility status PT Visit Diagnosis:  Difficulty in walking, not elsewhere classified (R26.2)     Time: 8115-7262 PT Time Calculation (min) (ACUTE ONLY): 23 min  Charges:  $Therapeutic Activity: 23-37 mins                    Donna Bernard, PT, MPT    Ina Homes 06/13/2020, 2:36 PM

## 2020-06-14 MED ORDER — LABETALOL HCL 100 MG PO TABS: 100.0000 mg | ORAL_TABLET | Freq: Two times a day (BID) | ORAL | 0 refills | Status: AC

## 2020-06-14 MED ORDER — LEVOTHYROXINE SODIUM 50 MCG PO TABS: 50.0000 ug | ORAL_TABLET | Freq: Every day | ORAL | 0 refills | Status: AC

## 2020-06-14 NOTE — Plan of Care (Signed)
  Problem: Education: Goal: Knowledge of General Education information will improve Description: Including pain rating scale, medication(s)/side effects and non-pharmacologic comfort measures Outcome: Progressing   Problem: Health Behavior/Discharge Planning: Goal: Ability to manage health-related needs will improve Outcome: Progressing   Problem: Clinical Measurements: Goal: Ability to maintain clinical measurements within normal limits will improve Outcome: Progressing Goal: Will remain free from infection Outcome: Progressing Goal: Diagnostic test results will improve Outcome: Progressing Goal: Respiratory complications will improve Outcome: Progressing Goal: Cardiovascular complication will be avoided Outcome: Progressing   Problem: Clinical Measurements: Goal: Will remain free from infection Outcome: Progressing   Problem: Nutrition: Goal: Adequate nutrition will be maintained Outcome: Progressing   Problem: Coping: Goal: Level of anxiety will decrease Outcome: Progressing   Problem: Pain Managment: Goal: General experience of comfort will improve Outcome: Progressing   Problem: Safety: Goal: Ability to remain free from injury will improve Outcome: Progressing

## 2020-06-14 NOTE — Progress Notes (Signed)
Patient discharged home IV removed from RFA tolerated well. Personal belongings with patient VSS. Transported via w/c to personal vehicle with son

## 2020-06-14 NOTE — Discharge Instructions (Signed)
     Syncope Syncope is when you pass out (faint) for a short time. It is caused by a sudden decrease in blood flow to the brain. Signs that you may be about to pass out include:  Feeling dizzy or light-headed.  Feeling sick to your stomach (nauseous).  Seeing all white or all black.  Having cold, clammy skin. If you pass out, get help right away. Call your local emergency services (911 in the [place] Do not drive yourself to the hospital. Follow these instructions at home: Watch for any changes in your symptoms. Take these actions to stay safe and help with your symptoms: Lifestyle  Do not drive, use machinery, or play sports until your doctor says it is okay.  Do not drink alcohol.  Do not use any products that contain nicotine or tobacco, such as cigarettes and e-cigarettes. If you need help quitting, ask your doctor.  Drink enough fluid to keep your pee (urine) pale yellow. General instructions  Take over-the-counter and prescription medicines only as told by your doctor.  If you are taking blood pressure or heart medicine, sit up and stand up slowly. Spend a few minutes getting ready to sit and then stand. This can help you feel less dizzy.  Have someone stay with you until you feel stable.  If you start to feel like you might pass out, lie down right away and raise (elevate) your feet above the level of your heart. Breathe deeply and steadily. Wait until all of the symptoms are gone.  Keep all follow-up visits as told by your doctor. This is important. Get help right away if:  You have a very bad headache.  You pass out once or more than once.  You have pain in your chest, belly, or back.  You have a very fast or uneven heartbeat (palpitations).  It hurts to breathe.  You are bleeding from your mouth or your bottom (rectum).  You have black or tarry poop (stool).  You have jerky movements that you cannot control (seizure).  You are confused.  You have  trouble walking.  You are very weak.  You have vision problems. These symptoms may be an emergency. Do not wait to see if the symptoms will go away. Get medical help right away. Call your local emergency services (911 in the [place] Do not drive yourself to the hospital. Summary  Syncope is when you pass out (faint) for a short time. It is caused by a sudden decrease in blood flow to the brain.  Signs that you may be about to faint include feeling dizzy, light-headed, or sick to your stomach, seeing all white or all black, or having cold, clammy skin.  If you start to feel like you might pass out, lie down right away and raise (elevate) your feet above the level of your heart. Breathe deeply and steadily. Wait until all of the symptoms are gone. This information is not intended to replace advice given to you by your health care provider. Make sure you discuss any questions you have with your health care provider. Document Revised: 11/27/2017 Document Reviewed: 11/27/2017 Elsevier Patient Education  2020 Elsevier Inc.  

## 2020-06-14 NOTE — Care Management Important Message (Signed)
Important Message  Patient Details  Name: Mary Madden MRN: [id number] Date of Birth: 1956/01/18   Medicare Important Message Given:  N/A - LOS <3 / Initial given by admissions     Olegario Messier A Hideo Googe 06/14/2020, 9:52 AM

## 2020-06-14 NOTE — TOC Initial Note (Signed)
Transition of Care [facility]) - Initial/Assessment Note    Patient Details  Name: Mary Madden MRN: [id number] Date of Birth: [birth year]  [facility] of [facility]) CM/SW Contact:    Shelbie Ammons, RN Phone Number: 06/14/2020, 8:52 AM  Clinical Narrative:    RNCM met with patient at bedside and spoke with son by phone [phone]. Patient and son are both agreeable to home health services being set up and deny preference as to who that agency is. Son reports that he has been waiting for update from MD and would like to speak to both her and bedside nurse prior to his mom discharging.  RNCM spoke with Corene Cornea with [facility] and he will accept referral.                Expected Discharge Plan: [place] Barriers to Discharge: No Barriers Identified   Patient Goals and CMS Choice     Choice offered to / list presented to : Patient  Expected Discharge Plan and Services Expected Discharge Plan: [place] Choice: [place] arrangements for the past 2 months: Single Family Home Expected Discharge Date: 06/14/20                         HH Arranged: PT, OT HH Agency: [facility] ([place]) Date HH Agency Contacted: 06/14/20 Time Lane: [phone] Representative spoke with at [place]: Corene Cornea  Prior Living Arrangements/Services Living arrangements for the past 2 months: [place] with:: Adult Children Patient language and need for interpreter reviewed:: Yes Do you feel safe going back to the place where you live?: Yes      Need for Family Participation in Patient Care: Yes (Comment) Care giver support system in place?: Yes (comment)   Criminal Activity/Legal Involvement Pertinent to Current Situation/Hospitalization: No - Comment as needed  Activities of Daily Living Home Assistive Devices/Equipment: None ADL Screening (condition at time of admission) Patient's cognitive ability adequate  to safely complete daily activities?: No Is the patient deaf or have difficulty hearing?: No Does the patient have difficulty seeing, even when wearing glasses/contacts?: No Does the patient have difficulty concentrating, remembering, or making decisions?: Yes Patient able to express need for assistance with ADLs?: Yes Does the patient have difficulty dressing or bathing?: No Independently performs ADLs?: No Communication: Independent Dressing (OT): Needs assistance Is this a change from baseline?: Pre-admission baseline Grooming: Needs assistance Is this a change from baseline?: Pre-admission baseline Feeding: Independent Bathing: Needs assistance Is this a change from baseline?: Pre-admission baseline Toileting: Independent In/Out Bed: Independent Walks in Home: Independent Does the patient have difficulty walking or climbing stairs?: Yes (Climbing stairs) Weakness of Legs: Both Weakness of Arms/Hands: None  Permission Sought/Granted                  Emotional Assessment Appearance:: Appears stated age Attitude/Demeanor/Rapport: Engaged Affect (typically observed): Appropriate, Anxious Orientation: : Oriented to Self, Oriented to Place, Oriented to  Time, Oriented to Situation Alcohol / Substance Use: Not Applicable Psych Involvement: No (comment)  Admission diagnosis:  Syncope [R55] Urinary tract infection without hematuria, site unspecified [N39.0] Altered mental status, unspecified altered mental status type [R41.82] Syncope, unspecified syncope type [R55] Patient Active Problem List   Diagnosis Date Noted  . Syncope 06/11/2020  . UTI (urinary tract infection) 06/11/2020  . Acute metabolic encephalopathy 77/82/4235  . Elevated LFTs 06/11/2020  .  Elevated troponin 06/11/2020  . AKI (acute kidney injury) ([place]) 07/22/2017  . Hypothyroidism 07/22/2017  . Syncope and collapse 07/22/2017   PCP:  Patient, No Pcp Per Pharmacy:   [facility] [place]  [address] [place] [address] Phone: [phone] Fax: [phone]     Social Determinants of Health (SDOH) Interventions    Readmission Risk Interventions No flowsheet data found.

## 2020-06-14 NOTE — Discharge Summary (Signed)
Discharge Summary  Mary Madden ZOX:[id number] DOB: 1956-05-20  PCP: Patient, No Pcp Per  Admit date: 06/10/2020 Discharge date: 06/14/2020  Time spent: 35 minutes.   Recommendations for Outpatient Follow-up:  Follow-up with your PCP Follow-up with your cardiologist Take your medications as prescribed Continue PT OT with assistance and fall precautions.  Discharge Diagnoses:  Active Hospital Problems   Diagnosis Date Noted  . Syncope 06/11/2020  . UTI (urinary tract infection) 06/11/2020  . Acute metabolic encephalopathy 06/11/2020  . Elevated LFTs 06/11/2020  . Elevated troponin 06/11/2020  . Hypothyroidism 07/22/2017    Resolved Hospital Problems  No resolved problems to display.    Discharge Condition: Stable  Diet recommendation: Resume previous diet.  Vitals:   06/14/20 0916 06/14/20 1157  BP:  (!) 123/59  Pulse:  (!) 101  Resp:  18  Temp: 98.1 F (36.7 C) 98.7 F (37.1 C)  SpO2:  98%    History of present illness:  Mary Madden [name] a 64 y.o.femalewho was brought to the emergency room from home after she was witnessed to have lost consciousness/passed out 3 times while sitting in a chair.  She finally came around but was confused. She did not fall.  History is provided by daughter who witnessed the episode and confusion. She had generalized malaise prior to this.  Later reported dysuria and increased urinary frequency.  ED Course:Upon arrival she was mildly tachycardic at 107. Blood work significant for leukocytosis of 11,000 with pyuria on urinalysis. She had elevation in liver enzymes with AST 172 and ALT 93. Troponin 85.  Was started on Rocephin, IV hydration. Hospitalist consulted for admission.  Reported new onset of bilateral lower extremity edema, started a week ago.  2D echo completed, results are pending.  Elevated TSH on presentation greater than 15.  Started on levothyroxine 50 mcg daily.    06/14/20: Seen and examined.    She has no  new complaints.  Eager to go home.    Hospital Course:  Principal Problem:   Syncope Active Problems:   Hypothyroidism   UTI (urinary tract infection)   Acute metabolic encephalopathy   Elevated LFTs   Elevated troponin  Syncope, suspect vasovagal -Suspect vasovagal/orthostatic hypotension secondary to UTI -Received IV hydration and IV antibiotics for symptomatic pyuria -No recurrence while hospitalized. -2D echo has been ordered, results are pending. --Call for a post hospital follow-up appointment with cardiologist Dr. Juliann Pares  Subclinical hypothyroidism Presented with TSH greater than 15 and normal free T4 At high risk for developing overt hypothyroidism Started treatment with levothyroxine 50 mcg daily Will need to follow-up with her primary care provider and repeat TSH in 6 weeks  Elevated troponin, suspect demand ischemia in the setting of tachycardia Sinus tachycardia on EKG Troponin S peaked at 105 and trended down She denies any anginal symptoms.  Presumptive UTI (urinary tract infection) -Self-reported she was symptomatic with dysuria and increasing frequency of urination Dysuria and frequency have now resolved, received 3 days of IV Rocephin Urine culture grew multiple species, recommended recollection Blood cultures negative to date  Bilateral lower extremity edema, suspect related to hypothyroidism TSH greater than 15 Started on levothyroxine 50 mcg daily Self-reported 1 week of bilateral lower extremity edema 2D echo results are pending  Essential hypertension with sinus tachycardia Started on labetalol 100 mg twice daily Follow-up with your PCP  Resolved acute metabolic encephalopathy -Suspect related to presumptive UTI   Elevated LFTs -EtOH level less than 10 -Hepatitis panel nonreactive Follow-up with your PCP  Code Status:full code   Consults called:none   Discharge Exam: BP (!) 123/59 (BP Location: Left Arm)   Pulse  (!) 101   Temp 98.7 F (37.1 C) (Oral)   Resp 18   Ht 5\' 6"  (1.676 m)   Wt 68 kg   LMP  (LMP Unknown)   SpO2 98%   BMI 24.21 kg/m  . General: 64 y.o. year-old female well developed well nourished in no acute distress.  Alert and interactive. . Cardiovascular: Regular rate and rhythm with no rubs or gallops.  No thyromegaly or JVD noted.   77 Respiratory: Clear to auscultation with no wheezes or rales. Good inspiratory effort. . Abdomen: Soft nontender nondistended with normal bowel sounds x4 quadrants. . Musculoskeletal: Trace lower extremity edema x 2. . Psychiatry: Mood is appropriate for condition and setting  Discharge Instructions You were cared for by a hospitalist during your hospital stay. If you have any questions about your discharge medications or the care you received while you were in the hospital after you are discharged, you can call the unit and asked to speak with the hospitalist on call if the hospitalist that took care of you is not available. Once you are discharged, your primary care physician will handle any further medical issues. Please note that NO REFILLS for any discharge medications will be authorized once you are discharged, as it is imperative that you return to your primary care physician (or establish a relationship with a primary care physician if you do not have one) for your aftercare needs so that they can reassess your need for medications and monitor your lab values.   Allergies as of 06/14/2020      Reactions   Contrast Media [iodinated Diagnostic Agents] Other (See Comments)   Reaction: unknown      Medication List    STOP taking these medications   acetaminophen 325 MG tablet Commonly known as: TYLENOL     TAKE these medications   labetalol 100 MG tablet Commonly known as: NORMODYNE Take 1 tablet (100 mg total) by mouth 2 (two) times daily. Notes to patient: May take next dose tonight at bedtime 9pm   levothyroxine 50 MCG tablet Commonly  known as: SYNTHROID Take 1 tablet (50 mcg total) by mouth daily at 6 (six) AM.      Allergies  Allergen Reactions  . Contrast Media [Iodinated Diagnostic Agents] Other (See Comments)    Reaction: unknown    Follow-up Information    June, MD On 06/16/2020.   Specialties: Cardiology, Internal Medicine Why: (Delicia White - PA-C)  @ 9:00 am Contact information: [address] [place] [phone]                The results of significant diagnostics from this hospitalization (including imaging, microbiology, ancillary and laboratory) are listed below for reference.    Significant Diagnostic Studies: CT Head Wo Contrast  Result Date: 06/10/2020 CLINICAL DATA:  Delirium EXAM: CT HEAD WITHOUT CONTRAST TECHNIQUE: Contiguous axial images were obtained from the base of the skull through the vertex without intravenous contrast. COMPARISON:  July 22, 2017 FINDINGS: Brain: No evidence of acute territorial infarction, hemorrhage, hydrocephalus,extra-axial collection or mass lesion/mass effect. Normal gray-white differentiation. Ventricles are normal in size and contour. Vascular: No hyperdense vessel or unexpected calcification. Skull: The skull is intact. No fracture or focal lesion identified. Sinuses/Orbits: The visualized paranasal sinuses and mastoid air cells are clear. The orbits and globes intact. Other: None IMPRESSION: No  acute intracranial abnormality. Electronically Signed   By: Jonna Clark M.D.   On: 06/10/2020 15:33   DG Chest Port 1 View  Result Date: 06/10/2020 CLINICAL DATA:  Syncope EXAM: PORTABLE CHEST 1 VIEW COMPARISON:  07/22/2017 chest radiograph and prior. FINDINGS: No focal consolidation. No pneumothorax or pleural effusion. Cardiomediastinal silhouette within normal limits. No acute osseous abnormality. Right upper quadrant surgical clips. IMPRESSION: No acute airspace disease. Electronically Signed   By: Stana Bunting M.D.    On: 06/10/2020 23:28    Microbiology: Recent Results (from the past 240 hour(s))  Urine culture     Status: Abnormal   Collection Time: 06/10/20  3:02 PM   Specimen: Urine, Random  Result Value Ref Range Status   Specimen Description   Final    URINE, RANDOM Performed at [facility], [address], [place], [place] [address]    Special Requests   Final    NONE Performed at [facility], [address] [place], [place] [address]    Culture MULTIPLE SPECIES PRESENT, SUGGEST RECOLLECTION (A)  Final   Report Status 06/12/2020 FINAL  Final  Culture, blood (routine x 2)     Status: None (Preliminary result)   Collection Time: 06/10/20 11:59 PM   Specimen: BLOOD  Result Value Ref Range Status   Specimen Description BLOOD LEFT ANTECUBITAL  Final   Special Requests   Final    BOTTLES DRAWN AEROBIC AND ANAEROBIC Blood Culture adequate volume   Culture   Final    NO GROWTH 3 DAYS Performed at [facility], [address], [place], [place] [address]    Report Status PENDING  Incomplete  Culture, blood (routine x 2)     Status: None (Preliminary result)   Collection Time: 06/10/20 11:59 PM   Specimen: BLOOD  Result Value Ref Range Status   Specimen Description BLOOD BLOOD RIGHT HAND  Final   Special Requests   Final    BOTTLES DRAWN AEROBIC AND ANAEROBIC Blood Culture adequate volume   Culture   Final    NO GROWTH 3 DAYS Performed at [facility], [address], [place], [place] [address]    Report Status PENDING  Incomplete  SARS Coronavirus 2 by RT PCR (hospital order, performed in [facility] hospital lab) Nasopharyngeal Nasopharyngeal Swab     Status: None   Collection Time: 06/10/20 11:59 PM   Specimen: Nasopharyngeal Swab  Result Value Ref Range Status   SARS Coronavirus 2 NEGATIVE NEGATIVE Final    Comment: (NOTE) SARS-CoV-2 target nucleic acids are NOT DETECTED.  The SARS-CoV-2 RNA is generally detectable in upper and  lower respiratory specimens during the acute phase of infection. The lowest concentration of SARS-CoV-2 viral copies this assay can detect is 250 copies / mL. A negative result does not preclude SARS-CoV-2 infection and should not be used as the sole basis for treatment or other patient management decisions.  A negative result may occur with improper specimen collection / handling, submission of specimen other than nasopharyngeal swab, presence of viral mutation(s) within the areas targeted by this assay, and inadequate number of viral copies (<250 copies / mL). A negative result must be combined with clinical observations, patient history, and epidemiological information.  Fact Sheet for Patients:   BoilerBrush.com.cy  Fact Sheet for Healthcare Providers: https://pope.com/  This test is not yet approved or  cleared by the [place] FDA and has been authorized for detection and/or diagnosis of SARS-CoV-2 by FDA under an Emergency  Use Authorization (EUA).  This EUA will remain in effect (meaning this test can be used) for the duration of the COVID-19 declaration under Section 564(b)(1) of the Act, 21 U.S.C. section 360bbb-3(b)(1), unless the authorization is terminated or revoked sooner.  Performed at [facility], [address] [place], [place] [id number]      Labs: Basic Metabolic Panel: Recent Labs  Lab 06/10/20 1502 06/11/20 0311 06/11/20 0515 06/12/20 0543  NA 140  --  137 139  K 3.8  --  3.7 3.7  CL 105  --  105 108  CO2 28  --  25 24  GLUCOSE 126*  --  111* 103*  BUN 12  --  11 6*  CREATININE 0.78 0.48 0.66 0.88  CALCIUM 8.1*  --  7.5* 7.5*  MG  --   --   --  1.9   Liver Function Tests: Recent Labs  Lab 06/10/20 2359 06/11/20 0515 06/12/20 0543  AST 172* 151* 128*  ALT 93* 78* 74*  ALKPHOS 66 64 61  BILITOT 0.6 0.7 0.5  PROT 6.7 5.6* 5.8*  ALBUMIN 2.3* 2.0* 2.0*   Recent Labs  Lab  06/10/20 2359  LIPASE 23   Recent Labs  Lab 06/10/20 2359  AMMONIA 10   CBC: Recent Labs  Lab 06/10/20 1502 06/11/20 0311 06/11/20 0515 06/12/20 0543  WBC 11.1* 8.1 9.2 9.9  NEUTROABS  --   --  6.7 7.0  HGB 10.8* 8.4* 9.4* 9.7*  HCT 33.8* 26.0* 29.7* 29.7*  MCV 82.4 83.6 83.0 82.3  PLT 317 232 291 279   Cardiac Enzymes: No results for input(s): CKTOTAL, CKMB, CKMBINDEX, TROPONINI in the last 168 hours. BNP: BNP (last 3 results) No results for input(s): BNP in the last 8760 hours.  ProBNP (last 3 results) No results for input(s): PROBNP in the last 8760 hours.  CBG: No results for input(s): GLUCAP in the last 168 hours.     Signed:  Darlin Droparole N Arjun Hard, MD [facility] 06/14/2020, 3:07 PM

## 2020-06-14 NOTE — Plan of Care (Signed)

## 2020-06-16 LAB — ECHOCARDIOGRAM COMPLETE
AR max vel: 1.57 cm2
AV Area VTI: 1.5 cm2
AV Area mean vel: 1.47 cm2
AV Mean grad: 6 mmHg
AV Peak grad: 10.8 mmHg
Ao pk vel: 1.64 m/s
Area-P 1/2: 6.17 cm2
Height: 66 in
S' Lateral: 3.23 cm
Weight: 2400 oz

## 2020-06-16 LAB — CULTURE, BLOOD (ROUTINE X 2)
Culture: NO GROWTH
Culture: NO GROWTH
Special Requests: ADEQUATE
Special Requests: ADEQUATE

## 2020-08-12 ENCOUNTER — Emergency Department: Payer: Medicare Other

## 2020-08-12 ENCOUNTER — Other Ambulatory Visit: Payer: Self-pay

## 2020-08-12 ENCOUNTER — Encounter: Payer: Self-pay | Admitting: Emergency Medicine

## 2020-08-12 ENCOUNTER — Emergency Department
Admission: EM | Admit: 2020-08-12 | Discharge: 2020-08-12 | Disposition: A | Discharge status: Home / Self Care | Payer: Medicare Other | Attending: Emergency Medicine | Admitting: Emergency Medicine

## 2020-08-12 DIAGNOSIS — Z79890 Hormone replacement therapy (postmenopausal): Secondary | ICD-10-CM | POA: Diagnosis not present

## 2020-08-12 DIAGNOSIS — E039 Hypothyroidism, unspecified: Secondary | ICD-10-CM | POA: Insufficient documentation

## 2020-08-12 DIAGNOSIS — R609 Edema, unspecified: Secondary | ICD-10-CM | POA: Insufficient documentation

## 2020-08-12 DIAGNOSIS — F17200 Nicotine dependence, unspecified, uncomplicated: Secondary | ICD-10-CM | POA: Insufficient documentation

## 2020-08-12 DIAGNOSIS — R2240 Localized swelling, mass and lump, unspecified lower limb: Secondary | ICD-10-CM | POA: Diagnosis present

## 2020-08-12 DIAGNOSIS — R600 Localized edema: Secondary | ICD-10-CM

## 2020-08-12 LAB — URINALYSIS, COMPLETE (UACMP) WITH MICROSCOPIC
Bilirubin Urine: NEGATIVE
Glucose, UA: NEGATIVE mg/dL
Ketones, ur: NEGATIVE mg/dL
Leukocytes,Ua: NEGATIVE
Nitrite: NEGATIVE
Protein, ur: NEGATIVE mg/dL
Specific Gravity, Urine: 1.016 (ref 1.005–1.030)
pH: 7 (ref 5.0–8.0)

## 2020-08-12 LAB — BASIC METABOLIC PANEL
Anion gap: 6 (ref 5–15)
BUN: 11 mg/dL (ref 8–23)
CO2: 28 mmol/L (ref 22–32)
Calcium: 8 mg/dL — ABNORMAL LOW (ref 8.9–10.3)
Chloride: 103 mmol/L (ref 98–111)
Creatinine, Ser: 0.89 mg/dL (ref 0.44–1.00)
GFR, Estimated: >60 mL/min (ref >60)
Glucose, Bld: 112 mg/dL — ABNORMAL HIGH (ref 70–99)
Potassium: 4 mmol/L (ref 3.5–5.1)
Sodium: 137 mmol/L (ref 135–145)

## 2020-08-12 LAB — CBC
HCT: 34.5 % — ABNORMAL LOW (ref 36.0–46.0)
Hemoglobin: 11.1 g/dL — ABNORMAL LOW (ref 12.0–15.0)
MCH: 26.7 pg (ref 26.0–34.0)
MCHC: 32.2 g/dL (ref 30.0–36.0)
MCV: 82.9 fL (ref 80.0–100.0)
Platelets: 310 10*3/uL (ref 150–400)
RBC: 4.16 MIL/uL (ref 3.87–5.11)
RDW: 18.3 % — ABNORMAL HIGH (ref 11.5–15.5)
WBC: 9.7 10*3/uL (ref 4.0–10.5)
nRBC: 0 % (ref 0.0–0.2)

## 2020-08-12 MED ORDER — FUROSEMIDE 40 MG PO TABS: 40.0000 mg | ORAL_TABLET | Freq: Every day | ORAL | 1 refills | Status: AC

## 2020-08-12 MED ORDER — FUROSEMIDE 10 MG/ML IJ SOLN
40.0000 mg | Freq: Once | INTRAMUSCULAR | Status: AC
  Administered 2020-08-12: 40 mg via INTRAVENOUS
  Filled 2020-08-12: qty 4

## 2020-08-12 NOTE — ED Notes (Signed)
ED Provider at bedside to speak with patients daughter.

## 2020-08-12 NOTE — ED Triage Notes (Signed)
C/O leg swelling x 3-4 months.  Also c/o DOE.  Sent to ED for evaluation from [facility].  AAOx3.  Skin warm and dry. NAD

## 2020-08-12 NOTE — ED Notes (Signed)
Pt's Left ankle wound cleaned with NS and dressed with telfa pad and wrap.  Pt instructed on how to keep clean and covered at home.

## 2020-08-12 NOTE — ED Provider Notes (Signed)
[facility] Emergency Department Provider Note   ____________________________________________    I have reviewed the triage vital signs and the nursing notes.   HISTORY  Chief Complaint Leg Swelling     HPI Mary Madden is a 64 y.o. female with history of hypothyroidism, MI who presents with complaints of swelling in the legs which has been occurring over the last several weeks to months.  Daughter took her to [facility] today for evaluation noting that gradually over the last several months patient is at increased fatigue with dyspnea on significant exertion as well.  Swelling seemed worse this morning but has improved now.  Patient denies shortness of breath at rest.  Is not on Lasix or diuretic.  Review of medical records demonstrates an EF of 60% in August  Past Medical History:  Diagnosis Date  . Hypothyroidism   . MI (myocardial infarction) (HCC)   . Thyroid disease     Patient Active Problem List   Diagnosis Date Noted  . Syncope 06/11/2020  . UTI (urinary tract infection) 06/11/2020  . Acute metabolic encephalopathy 06/11/2020  . Elevated LFTs 06/11/2020  . Elevated troponin 06/11/2020  . AKI (acute kidney injury) (HCC) 07/22/2017  . Hypothyroidism 07/22/2017  . Syncope and collapse 07/22/2017    Past Surgical History:  Procedure Laterality Date  . NO PAST SURGERIES      Prior to Admission medications   Medication Sig Start Date End Date Taking? Authorizing Provider  levothyroxine (SYNTHROID) 50 MCG tablet Take 1 tablet (50 mcg total) by mouth daily at 6 (six) AM. 06/14/20 08/13/20 Yes Darlin Drop, DO  labetalol (NORMODYNE) 100 MG tablet Take 1 tablet (100 mg total) by mouth 2 (two) times daily. 06/14/20 08/13/20  Darlin Drop, DO     Allergies Contrast media [iodinated diagnostic agents]  Family History  Family history unknown: Yes    Social History Social History   Tobacco Use  . Smoking status: Current  Every Day Smoker  . Smokeless tobacco: Never Used  Substance Use Topics  . Alcohol use: No  . Drug use: No    Review of Systems  Constitutional: No fever/chills Eyes: No visual changes.  ENT: No sore throat. Cardiovascular: No chest pain Respiratory: Shortness breath with exertion Gastrointestinal: No abdominal pain.  No nausea, no vomiting.   Genitourinary: Negative for dysuria. Musculoskeletal: Swelling to the legs bilaterally, no tenderness Skin: Negative for rash. Neurological: Negative for headaches or weakness   ____________________________________________   PHYSICAL EXAM:  VITAL SIGNS: ED Triage Vitals  Enc Vitals Group     BP 08/12/20 1100 136/68     Pulse Rate 08/12/20 1100 (!) 110     Resp 08/12/20 1100 18     Temp 08/12/20 1100 98.5 F (36.9 C)     Temp Source 08/12/20 1100 Oral     SpO2 08/12/20 1100 99 %     Weight 08/12/20 1058 73.9 kg (163 lb)     Height 08/12/20 1058 1.676 m (5\' 6" )     Head Circumference --      Peak Flow --      Pain Score 08/12/20 1058 6     Pain Loc --      Pain Edu? --      Excl. in GC? --     Constitutional: Alert and oriented. No acute distress. Pleasant and interactive  Nose: No congestion/rhinnorhea. Mouth/Throat: Mucous membranes are moist.   Neck:  Painless ROM Cardiovascular: Normal rate, regular  rhythm. Grossly normal heart sounds.  Good peripheral circulation. Respiratory: Normal respiratory effort.  No retractions. Lungs CTAB. Gastrointestinal: Soft and nontender. No distention.  No CVA tenderness.  Musculoskeletal: 1+ edema bilaterally.  Warm and well perfused Neurologic:  Normal speech and language. No gross focal neurologic deficits are appreciated.  Skin:  Skin is warm, dry.  Small ulceration to the left lower leg Psychiatric: Mood and affect are normal. Speech and behavior are normal.  ____________________________________________   LABS (all labs ordered are listed, but only abnormal results are  displayed)  Labs Reviewed  BASIC METABOLIC PANEL - Abnormal; Notable for the following components:      Result Value   Glucose, Bld 112 (*)    Calcium 8.0 (*)    All other components within normal limits  CBC - Abnormal; Notable for the following components:   Hemoglobin 11.1 (*)    HCT 34.5 (*)    RDW 18.3 (*)    All other components within normal limits  URINALYSIS, COMPLETE (UACMP) WITH MICROSCOPIC - Abnormal; Notable for the following components:   Color, Urine YELLOW (*)    APPearance HAZY (*)    Hgb urine dipstick SMALL (*)    Bacteria, UA RARE (*)    All other components within normal limits   ____________________________________________  EKG  ED ECG REPORT I, Jene Every, the attending physician, personally viewed and interpreted this ECG.  Date: 08/12/2020  Rhythm: normal sinus rhythm QRS Axis: normal Intervals: normal ST/T Wave abnormalities: normal Narrative Interpretation: no evidence of acute ischemia  ____________________________________________  RADIOLOGY  Chest x-ray reviewed by me, possible mild interstitial edema ____________________________________________   PROCEDURES  Procedure(s) performed: No  Procedures   Critical Care performed: No ____________________________________________   INITIAL IMPRESSION / ASSESSMENT AND PLAN / ED COURSE  Pertinent labs & imaging results that were available during my care of the patient were reviewed by me and considered in my medical decision making (see chart for details).  Patient presents with lower extremity swelling over the last several months, some dyspnea on exertion however not at rest.  Comfortable and overall well-appearing at this time.  She does have 1+ edema bilaterally in the lower extremities, reviewed records, EF of 60% recently however still suspicious for normal ejection fraction CHF  Chest x-ray demonstrates cardiomegaly and mild interstitial edema with small bilateral pleural  effusions.  Overall presentation is consistent with normal ejection fraction CHF, patient does have a cardiologist with she follows with.  We will give IV Lasix here, discharged on p.o. Lasix and have her follow-up closely with cardiology for further evaluation and management    ____________________________________________   FINAL CLINICAL IMPRESSION(S) / ED DIAGNOSES  Final diagnoses:  Peripheral edema        Note:  This document was prepared using Dragon voice recognition software and may include unintentional dictation errors.   Jene Every, MD 08/12/20 1445

## 2021-03-23 IMAGING — DX DG CHEST 1V PORT
1 series · 1 of 1 positions shown · non-contrast
Comparison: 07/22/2017 chest radiograph and prior.

CLINICAL DATA: Syncope

EXAM:
PORTABLE CHEST 1 VIEW

[chest ap]
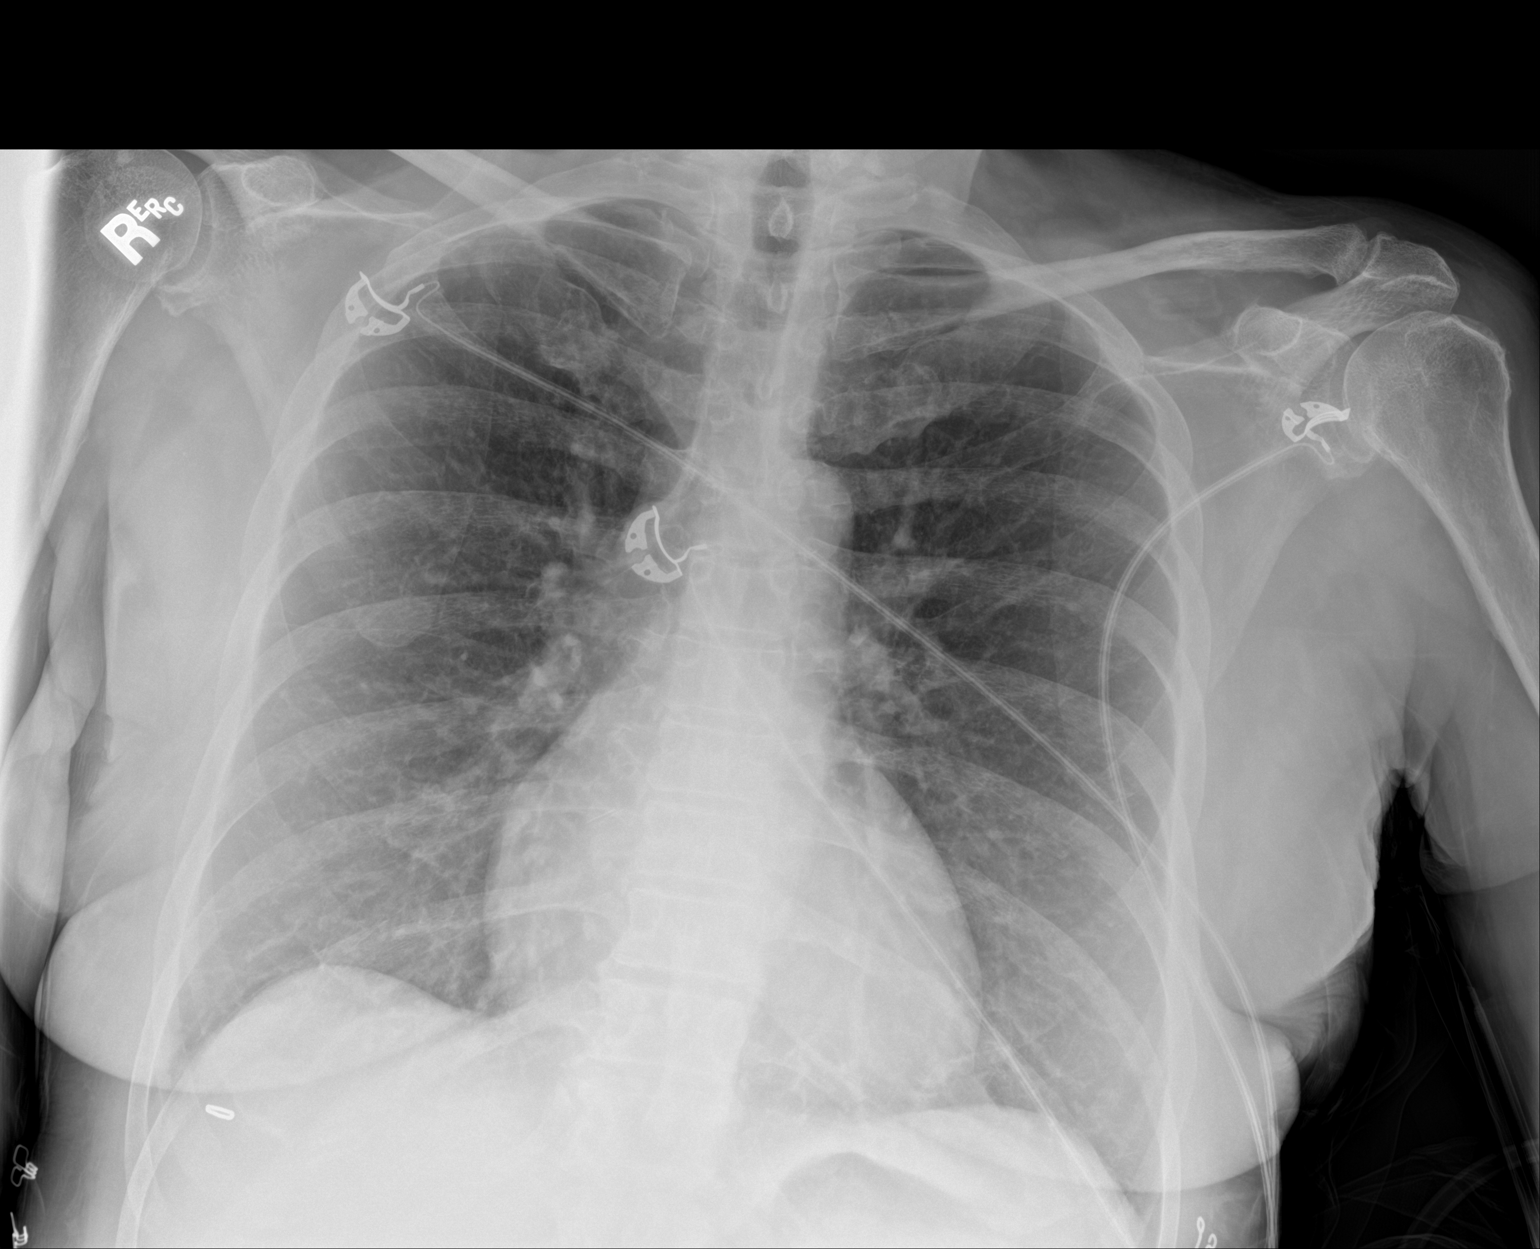

[1 of 1 positions shown; findings below may reference images not displayed]

FINDINGS: No focal consolidation. No pneumothorax or pleural effusion.
Cardiomediastinal silhouette within normal limits. No acute osseous
abnormality. Right upper quadrant surgical clips.
IMPRESSION: No acute airspace disease.

## 2024-06-18 ENCOUNTER — Other Ambulatory Visit: Payer: Self-pay | Admitting: Internal Medicine

## 2024-06-18 DIAGNOSIS — F17210 Nicotine dependence, cigarettes, uncomplicated: Secondary | ICD-10-CM

## 2024-06-18 DIAGNOSIS — I5032 Chronic diastolic (congestive) heart failure: Secondary | ICD-10-CM

## 2024-07-03 ENCOUNTER — Ambulatory Visit
Admission: RE | Admit: 2024-07-03 | Discharge: 2024-07-03 | Disposition: A | Discharge status: Home / Self Care | Source: Ambulatory Visit | Attending: Internal Medicine | Admitting: Internal Medicine

## 2024-07-03 DIAGNOSIS — F17210 Nicotine dependence, cigarettes, uncomplicated: Secondary | ICD-10-CM | POA: Insufficient documentation

## 2024-07-03 DIAGNOSIS — I5032 Chronic diastolic (congestive) heart failure: Secondary | ICD-10-CM | POA: Insufficient documentation

## 2024-10-27 ENCOUNTER — Other Ambulatory Visit: Payer: Self-pay

## 2024-10-27 ENCOUNTER — Emergency Department

## 2024-10-27 ENCOUNTER — Emergency Department
Admission: EM | Admit: 2024-10-27 | Discharge: 2024-10-27 | Discharge status: Against Medical Advice | Attending: Emergency Medicine | Admitting: Emergency Medicine

## 2024-10-27 DIAGNOSIS — R079 Chest pain, unspecified: Secondary | ICD-10-CM | POA: Insufficient documentation

## 2024-10-27 DIAGNOSIS — Z5321 Procedure and treatment not carried out due to patient leaving prior to being seen by health care provider: Secondary | ICD-10-CM | POA: Diagnosis not present

## 2024-10-27 LAB — CBC
HCT: 38 % (ref 36.0–46.0)
Hemoglobin: 12.1 g/dL (ref 12.0–15.0)
MCH: 25.9 pg — ABNORMAL LOW (ref 26.0–34.0)
MCHC: 31.8 g/dL (ref 30.0–36.0)
MCV: 81.4 fL (ref 80.0–100.0)
Platelets: 228 K/uL (ref 150–400)
RBC: 4.67 MIL/uL (ref 3.87–5.11)
RDW: 13.2 % (ref 11.5–15.5)
WBC: 7.5 K/uL (ref 4.0–10.5)
nRBC: 0 % (ref 0.0–0.2)

## 2024-10-27 LAB — BASIC METABOLIC PANEL WITH GFR
Anion gap: 10 (ref 5–15)
BUN: 10 mg/dL (ref 8–23)
CO2: 28 mmol/L (ref 22–32)
Calcium: 9.2 mg/dL (ref 8.9–10.3)
Chloride: 104 mmol/L (ref 98–111)
Creatinine, Ser: 0.77 mg/dL (ref 0.44–1.00)
GFR, Estimated: >60 mL/min
Glucose, Bld: 169 mg/dL — ABNORMAL HIGH (ref 70–99)
Potassium: 3.9 mmol/L (ref 3.5–5.1)
Sodium: 142 mmol/L (ref 135–145)

## 2024-10-27 LAB — TROPONIN T, HIGH SENSITIVITY: Troponin T High Sensitivity: 33 ng/L — ABNORMAL HIGH (ref 0–19)

## 2024-10-27 NOTE — ED Triage Notes (Signed)
 Pt reports intermittent chest pain for the past few months. Pt reports she has hx of indigestion and it feels the same.  No known cardiac hx

## 2024-10-28 ENCOUNTER — Telehealth: Payer: Self-pay | Admitting: Emergency Medicine

## 2024-10-28 NOTE — Telephone Encounter (Signed)
 Called patient due to left emergency department before provider exam to inquire about condition and follow up plans. Daughter says she is her caregiver.  She says she is going to make her an appt withpcp.  I explained that Ms Deatra never saw the provider, and that her pain could be caused by her heart.  She is going to call the pcp to see if they can see her/review labs and advise them.
